# Patient Record
Sex: Female | Born: 1985 | Race: White | Hispanic: No | Marital: Married | Smoking: Never smoker
Health system: Southern US, Community
[De-identification: ages and names within clinical notes are randomized; demographics above are authoritative.]

## PROBLEM LIST (undated history)

## (undated) ENCOUNTER — Inpatient Hospital Stay (HOSPITAL_COMMUNITY): Payer: Self-pay

## (undated) DIAGNOSIS — Z789 Other specified health status: Secondary | ICD-10-CM

## (undated) DIAGNOSIS — R112 Nausea with vomiting, unspecified: Secondary | ICD-10-CM

## (undated) DIAGNOSIS — Z9889 Other specified postprocedural states: Secondary | ICD-10-CM

## (undated) HISTORY — PX: WISDOM TOOTH EXTRACTION: SHX21

## (undated) HISTORY — PX: HAND SURGERY: SHX662

## (undated) HISTORY — DX: Other specified postprocedural states: R11.2

## (undated) HISTORY — DX: Other specified postprocedural states: Z98.890

## (undated) HISTORY — PX: OTHER SURGICAL HISTORY: SHX169

## (undated) HISTORY — PX: NECK SURGERY: SHX720

---
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
## 2006-11-14 ENCOUNTER — Other Ambulatory Visit: Admission: RE | Admit: 2006-11-14 | Discharge: 2006-11-14 | Payer: Self-pay | Admitting: Obstetrics and Gynecology

## 2007-12-09 ENCOUNTER — Other Ambulatory Visit: Admission: RE | Admit: 2007-12-09 | Discharge: 2007-12-09 | Payer: Self-pay | Admitting: Obstetrics and Gynecology

## 2009-04-07 ENCOUNTER — Other Ambulatory Visit: Admission: RE | Admit: 2009-04-07 | Discharge: 2009-04-07 | Payer: Self-pay | Admitting: Obstetrics and Gynecology

## 2010-01-04 ENCOUNTER — Ambulatory Visit
Admission: RE | Admit: 2010-01-04 | Discharge: 2010-01-04 | Payer: Self-pay | Source: Home / Self Care | Attending: Sports Medicine | Admitting: Sports Medicine

## 2010-01-04 DIAGNOSIS — R109 Unspecified abdominal pain: Secondary | ICD-10-CM | POA: Insufficient documentation

## 2010-02-02 NOTE — Assessment & Plan Note (Signed)
Summary: NP,RT RIB CAGE CRAMPING,RUNNER,MC   Vital Signs:  Patient profile:   25 year old female Height:      65 inches Weight:      140 pounds BMI:     23.38 Pulse rate:   83 / minute BP sitting:   116 / 73  (left arm)  Vitals Entered By: Rochele Pages RN (January 04, 2010 10:01 AM) CC: rt rib cage cramping x 6 months   CC:  rt rib cage cramping x 6 months.  History of Present Illness: 63 F presents to clinic with rt ant rib cage cramping with running.  Cramping starts around mile 1.5 mile and is intense, then she needs to walk.  Cramping subsides after walking approx 2 minutes.  Cannot continue run without pain returning quickly.  Runs 10-13 miles per week.  Drinking plenty of water throughout day, eats bananas daily.  Denies heart, lung, respiratory problems. No other RUQ following fatty meals. Denies possible pregnancy.  Preventive Screening-Counseling & Management  Alcohol-Tobacco     Smoking Status: never  Allergies (verified): No Known Drug Allergies  Past History:  Past Medical History: denies  Past Surgical History: denies  Family History: denies h/o sudden death or CAD  Social History: Occupation: Audiological scientist estate Single Never Smoked Smoking Status:  never Occupation:  employed  Review of Systems  The patient denies anorexia, fever, weight loss, hematochezia, and severe indigestion/heartburn.    Physical Exam  General:  Well-developed,well-nourished,in no acute distress; alert,appropriate and cooperative throughout examination Head:  normocephalic.   Eyes:  vision grossly intact.   Neck:  supple.   Chest Wall:  No deformities, masses, or tenderness noted. Lungs:  Normal respiratory effort, chest expands symmetrically. Lungs are clear to auscultation, no crackles or wheezes. Heart:  Normal rate and regular rhythm. S1 and S2 normal without gallop, murmur, click, rub or other extra sounds. Abdomen:  Bowel sounds positive,abdomen soft and non-tender without  masses, organomegaly or hernias noted.  Neg Murphy's. Msk:  Leg length: equal Spine: mild thoracic scoliosis  Gait: Mild Rt sided tilt with walking. Mild intoeing with Rt foot. Running gait nl Neurologic:  alert & oriented X3.     Impression & Recommendations:  Problem # 1:  ABDOMINAL CRAMPS (ICD-789.00) Assessment New Pt has classic runner's stitch, likely caused by thoracic scoliosis altering her running gait slightly. - continue good hydration - see pt instructions for abd strength and rehab - f/u 6 weeks  Patient Instructions: 1)  Do 2-3 sets daily of all following exercises: 2)  Barbell lateral bends 3)  Barbell trunk rotations 4)  Front dips (Lt elbow to Rt knee, Rt elbow to Lt knee) 5)  Regular crunches 6)  Arm swings with dumb bells 7)  Follow up 6 weeks   Orders Added: 1)  New Patient Level III [43154]

## 2010-02-15 ENCOUNTER — Ambulatory Visit: Payer: Self-pay | Admitting: Sports Medicine

## 2010-04-11 ENCOUNTER — Other Ambulatory Visit: Payer: Self-pay | Admitting: Nurse Practitioner

## 2010-04-11 ENCOUNTER — Other Ambulatory Visit (HOSPITAL_COMMUNITY)
Admission: RE | Admit: 2010-04-11 | Discharge: 2010-04-11 | Disposition: A | Discharge status: Home / Self Care | Payer: 59 | Source: Ambulatory Visit | Attending: Obstetrics and Gynecology | Admitting: Obstetrics and Gynecology

## 2010-04-11 DIAGNOSIS — Z01419 Encounter for gynecological examination (general) (routine) without abnormal findings: Secondary | ICD-10-CM | POA: Insufficient documentation

## 2010-04-11 DIAGNOSIS — Z113 Encounter for screening for infections with a predominantly sexual mode of transmission: Secondary | ICD-10-CM | POA: Insufficient documentation

## 2010-05-19 NOTE — Op Note (Signed)
NAME:  Hailey Davis, Hailey Davis                        ACCOUNT NO.:  [id number]   MEDICAL RECORD NO.:  [id number]                   PATIENT TYPE:  AMB   LOCATION:  [facility]                                  FACILITY:  [facility]   PHYSICIAN:  Cindee Salt, M.D.                    DATE OF BIRTH:  September 16, 1985   DATE OF PROCEDURE:  09/14/2003  DATE OF DISCHARGE:                                 OPERATIVE REPORT   PREOPERATIVE DIAGNOSIS:  Fractured metacarpals right middle and right ring  fingers.   POSTOPERATIVE DIAGNOSIS:  Fractured metacarpals right middle and right ring  fingers.   OPERATION:  Open reduction and internal fixation fracture metacarpals right  ring and right middle fingers.   SURGEON:  Cindee Salt, M.D.   ASSISTANTCarolyne Fiscal   ANESTHESIA:  General.   HISTORY:  The patient is a 25 year old female who suffered a fall with a  fracture of her right middle and ring finger metacarpal.  This occurred when  she fell off a treadmill when she twisted her ankle.  She was seen in the  emergency room, placed in a splint, and referred.  She is seen with a spiral  oblique fracture of the proximal aspect of each finger.  She has overlap of  the middle onto the ring finger.   DESCRIPTION OF PROCEDURE:  She was brought to the operating room where a  general anesthetic was carried out without difficulty.  She was prepped  using DuraPrep in a supine position, right arm free.  The limb was  exsanguinated with an Esmarch bandage, tourniquet placed on the forearm, was  inflated to 200 mmHg.  A longitudinal incision was made between the middle  and ring finger metacarpals, carried down trough the subcutaneous tissues.  Bleeders were electrocauterized.  Neurovascular structures were protected.  The dissection was carried down between the extensor tendon.  The dissection  was carried over the middle finger, metacarpal first.  The fracture was  identified.  This was a long oblique rotated fracture.  The callus  and  granulation tissue was removed from between the fracture fragments.  A  reduction was performed.  The fracture was then clamped.  The flexion  extension showed no further angulation of overlap.  Drill holes were placed  for a 1.5 mm screw.  This was a 1.1 drill bit followed by a 1.5 till lag.  The screw head was counter sunk.  This was measured and found to be 9 mm.  Two screws were inserted, one distally, one proximally.  X-rays revealed  that the fracture line was still apparent.  A crack had formed in the dorsal  cortex between two screws and it was decided to proceed with a cerclage wire  to maintain reduction.  A 30 gauge monofilament stainless steel wire was  then inserted around the metacarpal.  This was then tightened over the two  screws.  After removing the distal screw to allow complete reduction, this  had very poor purchase and was redirected into the distal fragment.  This  firmly affixed it.  The wires were twisted and maintained around the screw  heads to maintain them in position.  This firmly closed the fracture site.  Again, flexion extension revealed no overlap.  An incision was then made in  the periosteum of the ring finger.  The fracture was maintained in reduction  and two screws were then placed.  These were each drilled and counter sunk.  These measured 8 and 7 mm.  These were placed.  X-rays revealed reduction of  the two fractures.  Flexion and extension revealed no angulation or overlap  to the fracture fragments.  The wound was copiously irrigated with saline.  The periosteum was then repaired with figure-of-eight 4-0 Vicryl sutures,  the subcutaneous tissue with 4-0 Vicryl and the skin with subcuticular 4-0  Monocryl sutures.  The area was infiltrated with 0.25% Marcaine without  epinephrine.  A sterile compressive dressing and splint was applied.  The patient  tolerated the procedure well and was taken to the recovery room for  observation in  satisfactory condition.  She is discharged home to return to  the [facility] in [place] in one week on Vicodin.                                               Cindee Salt, M.D.    GK/MEDQ  D:  09/14/2003  T:  09/14/2003  Job:  [id number]

## 2011-04-12 ENCOUNTER — Other Ambulatory Visit (HOSPITAL_COMMUNITY)
Admission: RE | Admit: 2011-04-12 | Discharge: 2011-04-12 | Disposition: A | Discharge status: Home / Self Care | Payer: 59 | Source: Ambulatory Visit | Attending: Obstetrics and Gynecology | Admitting: Obstetrics and Gynecology

## 2011-04-12 ENCOUNTER — Other Ambulatory Visit: Payer: Self-pay | Admitting: Nurse Practitioner

## 2011-04-12 DIAGNOSIS — Z113 Encounter for screening for infections with a predominantly sexual mode of transmission: Secondary | ICD-10-CM | POA: Insufficient documentation

## 2011-04-12 DIAGNOSIS — Z01419 Encounter for gynecological examination (general) (routine) without abnormal findings: Secondary | ICD-10-CM | POA: Insufficient documentation

## 2012-04-15 ENCOUNTER — Other Ambulatory Visit (HOSPITAL_COMMUNITY)
Admission: RE | Admit: 2012-04-15 | Discharge: 2012-04-15 | Disposition: A | Discharge status: Home / Self Care | Payer: 59 | Source: Ambulatory Visit | Attending: Obstetrics and Gynecology | Admitting: Obstetrics and Gynecology

## 2012-04-15 ENCOUNTER — Other Ambulatory Visit: Payer: Self-pay | Admitting: Nurse Practitioner

## 2012-04-15 DIAGNOSIS — Z01419 Encounter for gynecological examination (general) (routine) without abnormal findings: Secondary | ICD-10-CM | POA: Insufficient documentation

## 2015-04-28 ENCOUNTER — Other Ambulatory Visit (HOSPITAL_COMMUNITY)
Admission: RE | Admit: 2015-04-28 | Discharge: 2015-04-28 | Disposition: A | Discharge status: Home / Self Care | Payer: Commercial Managed Care - PPO | Source: Ambulatory Visit | Attending: Obstetrics and Gynecology | Admitting: Obstetrics and Gynecology

## 2015-04-28 ENCOUNTER — Other Ambulatory Visit: Payer: Self-pay | Admitting: Nurse Practitioner

## 2015-04-28 DIAGNOSIS — Z01419 Encounter for gynecological examination (general) (routine) without abnormal findings: Secondary | ICD-10-CM | POA: Diagnosis present

## 2015-04-29 LAB — CYTOLOGY - PAP

## 2016-08-08 ENCOUNTER — Other Ambulatory Visit: Payer: Self-pay | Admitting: Obstetrics and Gynecology

## 2016-08-08 DIAGNOSIS — R109 Unspecified abdominal pain: Principal | ICD-10-CM

## 2016-08-08 DIAGNOSIS — O26891 Other specified pregnancy related conditions, first trimester: Secondary | ICD-10-CM

## 2016-08-08 DIAGNOSIS — O30091 Twin pregnancy, unable to determine number of placenta and number of amniotic sacs, first trimester: Secondary | ICD-10-CM

## 2016-08-09 ENCOUNTER — Other Ambulatory Visit: Payer: Self-pay | Admitting: Obstetrics and Gynecology

## 2016-08-09 ENCOUNTER — Encounter (HOSPITAL_COMMUNITY): Payer: Self-pay

## 2016-08-09 ENCOUNTER — Ambulatory Visit (HOSPITAL_COMMUNITY)
Admission: RE | Admit: 2016-08-09 | Discharge: 2016-08-09 | Disposition: A | Discharge status: Home / Self Care | Payer: 59 | Source: Ambulatory Visit | Attending: Obstetrics and Gynecology | Admitting: Obstetrics and Gynecology

## 2016-08-09 DIAGNOSIS — O3680X2 Pregnancy with inconclusive fetal viability, fetus 2: Secondary | ICD-10-CM

## 2016-08-09 DIAGNOSIS — O3680X1 Pregnancy with inconclusive fetal viability, fetus 1: Secondary | ICD-10-CM

## 2016-08-09 DIAGNOSIS — Z3A09 9 weeks gestation of pregnancy: Secondary | ICD-10-CM

## 2016-08-09 DIAGNOSIS — O30031 Twin pregnancy, monochorionic/diamniotic, first trimester: Secondary | ICD-10-CM

## 2016-08-09 HISTORY — DX: Other specified health status: Z78.9

## 2016-08-09 NOTE — Progress Notes (Signed)
Maternal Fetal Medicine Consultation  Requesting Provider(s): [facility]  Primary OBDion Body: [place] Reason for consultation: Suspected monochorionic twins  HPI: 31yo P0 at 9+6 weeks by dates with twin gestation. US in office suspicious for monochorionic implantation. Pregnancy has been unremarkable so far. 2-3 pounds weight gain so far. No vaginal bleeding. Nearly constant nausea but little or no vomiting OB History: OB History    Gravida Para Term Preterm AB Living   1         0   SAB TAB Ectopic Multiple Live Births                  PMH:  Past Medical History:  Diagnosis Date  . Medical history non-contributory     PSH:  Past Surgical History:  Procedure Laterality Date  . arm surgery    . HAND SURGERY    . NECK SURGERY    . WISDOM TOOTH EXTRACTION     Meds: PNV, Diclegis Allergies: NKDA FH: See EPIC section Soc: See EPIC section  Review of Systems: no vaginal bleeding or cramping/contractions, no LOF, no nausea/vomiting. All other systems reviewed and are negative.  PNL: Patient is Rh negative   PE:  VS: See EPIC section GEN: well-appearing female ABD: gravid, NT  Please see separate document for fetal ultrasound report.  A/P:Monochorionic diamniotic twin gestation We complications common to all twin types, including risk of PTD, preeclampsia, and gestational diabetes, and the increased risk for fetal anomalies. I instructed her to begin an 81mg  ASA daily starting today. Her nutritional status is borderline. A more robust weight gain by 13 weeks is highly desirable. She is limited by her diet choices, but we discussed multiple vegetarian options to increase protein intake. I have asked her to strive for a 35/15/50 protein/fat/carb breakdown in her diet and have asked her to begin liquid supplements such as Boost or Ensure 3-4 cans per day. We discussed twin-to-twin transfusion at length, including the pathophysiology, presentation, and screening protocols. She has  an appointment to return for anatomic survey at 16 weeks and we will initiate scans every 2 weeks with EFW every other visit, if you so desire. All questions were answered  Thank you for the opportunity to be a part of the care of Hailey Davis. Please contact our office if we can be of further assistance.   I spent approximately 30 minutes with this patient with over 50% of time spent in face-to-face counseling.

## 2016-08-10 ENCOUNTER — Ambulatory Visit (HOSPITAL_COMMUNITY): Payer: Commercial Managed Care - PPO

## 2016-08-10 ENCOUNTER — Other Ambulatory Visit (HOSPITAL_COMMUNITY): Payer: Self-pay | Admitting: *Deleted

## 2016-08-10 DIAGNOSIS — O30039 Twin pregnancy, monochorionic/diamniotic, unspecified trimester: Secondary | ICD-10-CM

## 2016-08-29 ENCOUNTER — Encounter (HOSPITAL_COMMUNITY): Payer: Self-pay

## 2016-09-12 LAB — OB RESULTS CONSOLE HEPATITIS B SURFACE ANTIGEN: Hepatitis B Surface Ag: NEGATIVE

## 2016-09-12 LAB — OB RESULTS CONSOLE ABO/RH: RH Type: NEGATIVE

## 2016-09-12 LAB — OB RESULTS CONSOLE RUBELLA ANTIBODY, IGM: RUBELLA: IMMUNE

## 2016-09-12 LAB — OB RESULTS CONSOLE HIV ANTIBODY (ROUTINE TESTING): HIV: NONREACTIVE

## 2016-09-12 LAB — OB RESULTS CONSOLE RPR: RPR: NONREACTIVE

## 2016-09-12 LAB — OB RESULTS CONSOLE ANTIBODY SCREEN: ANTIBODY SCREEN: NEGATIVE

## 2016-09-27 ENCOUNTER — Other Ambulatory Visit (HOSPITAL_COMMUNITY): Payer: Self-pay | Admitting: Obstetrics and Gynecology

## 2016-09-27 ENCOUNTER — Ambulatory Visit (HOSPITAL_COMMUNITY)
Admission: RE | Admit: 2016-09-27 | Discharge: 2016-09-27 | Disposition: A | Discharge status: Home / Self Care | Payer: 59 | Source: Ambulatory Visit | Attending: Obstetrics and Gynecology | Admitting: Obstetrics and Gynecology

## 2016-09-27 ENCOUNTER — Other Ambulatory Visit (HOSPITAL_COMMUNITY): Payer: Self-pay | Admitting: *Deleted

## 2016-09-27 ENCOUNTER — Encounter (HOSPITAL_COMMUNITY): Payer: Self-pay

## 2016-09-27 DIAGNOSIS — Z3A16 16 weeks gestation of pregnancy: Secondary | ICD-10-CM

## 2016-09-27 DIAGNOSIS — O30032 Twin pregnancy, monochorionic/diamniotic, second trimester: Secondary | ICD-10-CM | POA: Diagnosis present

## 2016-09-27 DIAGNOSIS — Z363 Encounter for antenatal screening for malformations: Secondary | ICD-10-CM | POA: Insufficient documentation

## 2016-09-27 DIAGNOSIS — O30039 Twin pregnancy, monochorionic/diamniotic, unspecified trimester: Secondary | ICD-10-CM

## 2016-09-28 ENCOUNTER — Other Ambulatory Visit (HOSPITAL_COMMUNITY): Payer: Self-pay | Admitting: *Deleted

## 2016-09-28 DIAGNOSIS — O30039 Twin pregnancy, monochorionic/diamniotic, unspecified trimester: Secondary | ICD-10-CM

## 2016-10-11 ENCOUNTER — Ambulatory Visit (HOSPITAL_COMMUNITY)
Admission: RE | Admit: 2016-10-11 | Discharge: 2016-10-11 | Disposition: A | Discharge status: Home / Self Care | Payer: 59 | Source: Ambulatory Visit | Attending: Obstetrics and Gynecology | Admitting: Obstetrics and Gynecology

## 2016-10-11 ENCOUNTER — Other Ambulatory Visit (HOSPITAL_COMMUNITY): Payer: Self-pay | Admitting: Obstetrics and Gynecology

## 2016-10-11 ENCOUNTER — Encounter (HOSPITAL_COMMUNITY): Payer: Self-pay

## 2016-10-11 DIAGNOSIS — O321XX2 Maternal care for breech presentation, fetus 2: Secondary | ICD-10-CM | POA: Insufficient documentation

## 2016-10-11 DIAGNOSIS — O30039 Twin pregnancy, monochorionic/diamniotic, unspecified trimester: Secondary | ICD-10-CM

## 2016-10-11 DIAGNOSIS — O30032 Twin pregnancy, monochorionic/diamniotic, second trimester: Secondary | ICD-10-CM | POA: Diagnosis present

## 2016-10-11 DIAGNOSIS — Z3A18 18 weeks gestation of pregnancy: Secondary | ICD-10-CM | POA: Insufficient documentation

## 2016-10-25 ENCOUNTER — Encounter (HOSPITAL_COMMUNITY): Payer: Self-pay

## 2016-10-25 ENCOUNTER — Ambulatory Visit (HOSPITAL_COMMUNITY)
Admission: RE | Admit: 2016-10-25 | Discharge: 2016-10-25 | Disposition: A | Discharge status: Home / Self Care | Payer: 59 | Source: Ambulatory Visit | Attending: Obstetrics and Gynecology | Admitting: Obstetrics and Gynecology

## 2016-10-25 ENCOUNTER — Other Ambulatory Visit (HOSPITAL_COMMUNITY): Payer: Self-pay | Admitting: Obstetrics and Gynecology

## 2016-10-25 DIAGNOSIS — O30039 Twin pregnancy, monochorionic/diamniotic, unspecified trimester: Secondary | ICD-10-CM | POA: Diagnosis present

## 2016-10-25 DIAGNOSIS — Z362 Encounter for other antenatal screening follow-up: Secondary | ICD-10-CM

## 2016-10-25 DIAGNOSIS — Z3A20 20 weeks gestation of pregnancy: Secondary | ICD-10-CM

## 2016-10-25 DIAGNOSIS — O30032 Twin pregnancy, monochorionic/diamniotic, second trimester: Secondary | ICD-10-CM | POA: Diagnosis not present

## 2016-11-08 ENCOUNTER — Encounter (HOSPITAL_COMMUNITY): Payer: Self-pay

## 2016-11-08 ENCOUNTER — Ambulatory Visit (HOSPITAL_COMMUNITY)
Admission: RE | Admit: 2016-11-08 | Discharge: 2016-11-08 | Disposition: A | Discharge status: Home / Self Care | Payer: 59 | Source: Ambulatory Visit | Attending: Obstetrics and Gynecology | Admitting: Obstetrics and Gynecology

## 2016-11-08 DIAGNOSIS — O30032 Twin pregnancy, monochorionic/diamniotic, second trimester: Secondary | ICD-10-CM | POA: Insufficient documentation

## 2016-11-08 DIAGNOSIS — O30039 Twin pregnancy, monochorionic/diamniotic, unspecified trimester: Secondary | ICD-10-CM

## 2016-11-08 DIAGNOSIS — Z3A22 22 weeks gestation of pregnancy: Secondary | ICD-10-CM | POA: Insufficient documentation

## 2016-11-08 DIAGNOSIS — O09892 Supervision of other high risk pregnancies, second trimester: Secondary | ICD-10-CM | POA: Insufficient documentation

## 2016-11-21 ENCOUNTER — Ambulatory Visit (HOSPITAL_COMMUNITY): Payer: Commercial Managed Care - PPO

## 2016-12-06 ENCOUNTER — Ambulatory Visit (HOSPITAL_COMMUNITY): Payer: Commercial Managed Care - PPO

## 2016-12-20 ENCOUNTER — Ambulatory Visit (HOSPITAL_COMMUNITY): Payer: Commercial Managed Care - PPO

## 2017-01-25 ENCOUNTER — Encounter (HOSPITAL_COMMUNITY): Payer: Self-pay

## 2017-01-25 ENCOUNTER — Inpatient Hospital Stay (HOSPITAL_COMMUNITY)
Admission: AD | Admit: 2017-01-25 | Discharge: 2017-01-27 | DRG: 819 | Disposition: A | Discharge status: Home / Self Care | Payer: 59 | Source: Ambulatory Visit | Attending: Obstetrics and Gynecology | Admitting: Obstetrics and Gynecology

## 2017-01-25 ENCOUNTER — Encounter: Payer: Self-pay | Admitting: General Practice

## 2017-01-25 ENCOUNTER — Other Ambulatory Visit: Payer: Self-pay

## 2017-01-25 DIAGNOSIS — O91119 Abscess of breast associated with pregnancy, unspecified trimester: Secondary | ICD-10-CM | POA: Diagnosis present

## 2017-01-25 DIAGNOSIS — O91113 Abscess of breast associated with pregnancy, third trimester: Principal | ICD-10-CM | POA: Diagnosis present

## 2017-01-25 DIAGNOSIS — Z3A34 34 weeks gestation of pregnancy: Secondary | ICD-10-CM | POA: Diagnosis not present

## 2017-01-25 DIAGNOSIS — O30033 Twin pregnancy, monochorionic/diamniotic, third trimester: Secondary | ICD-10-CM | POA: Diagnosis present

## 2017-01-25 LAB — CBC
HEMATOCRIT: 36.5 % (ref 36.0–46.0)
HEMOGLOBIN: 12.7 g/dL (ref 12.0–15.0)
MCH: 31.4 pg (ref 26.0–34.0)
MCHC: 34.8 g/dL (ref 30.0–36.0)
MCV: 90.3 fL (ref 78.0–100.0)
Platelets: 168 10*3/uL (ref 150–400)
RBC: 4.04 MIL/uL (ref 3.87–5.11)
RDW: 12.9 % (ref 11.5–15.5)
WBC: 14.4 10*3/uL — ABNORMAL HIGH (ref 4.0–10.5)

## 2017-01-25 LAB — CREATININE, SERUM
CREATININE: 0.7 mg/dL (ref 0.44–1.00)
GFR calc non Af Amer: >60 mL/min (ref >60)

## 2017-01-25 MED ORDER — CALCIUM CARBONATE ANTACID 500 MG PO CHEW: 2.0000 | CHEWABLE_TABLET | ORAL | Status: DC | PRN

## 2017-01-25 MED ORDER — ZOLPIDEM TARTRATE 5 MG PO TABS
5.0000 mg | ORAL_TABLET | Freq: Every evening | ORAL | Status: DC | PRN
  Administered 2017-01-25: 5 mg via ORAL
  Filled 2017-01-25: qty 1

## 2017-01-25 MED ORDER — ACETAMINOPHEN 325 MG PO TABS
650.0000 mg | ORAL_TABLET | ORAL | Status: DC | PRN
  Administered 2017-01-25 – 2017-01-27 (×3): 650 mg via ORAL
  Filled 2017-01-25 (×3): qty 2

## 2017-01-25 MED ORDER — ASPIRIN 81 MG PO CHEW
81.0000 mg | CHEWABLE_TABLET | Freq: Every day | ORAL | Status: DC
  Administered 2017-01-27: 81 mg via ORAL
  Filled 2017-01-25 (×4): qty 1

## 2017-01-25 MED ORDER — RISAQUAD PO CAPS
1.0000 | ORAL_CAPSULE | Freq: Two times a day (BID) | ORAL | Status: DC
  Administered 2017-01-25 – 2017-01-27 (×3): 1 via ORAL
  Filled 2017-01-25 (×6): qty 1

## 2017-01-25 MED ORDER — BACID PO TABS: 2.0000 | ORAL_TABLET | Freq: Two times a day (BID) | ORAL | Status: DC

## 2017-01-25 MED ORDER — DOCUSATE SODIUM 100 MG PO CAPS
100.0000 mg | ORAL_CAPSULE | Freq: Every day | ORAL | Status: DC
  Administered 2017-01-27: 100 mg via ORAL
  Filled 2017-01-25 (×2): qty 1

## 2017-01-25 MED ORDER — LACTATED RINGERS IV SOLN
INTRAVENOUS | Status: DC
  Administered 2017-01-25 – 2017-01-26 (×3): via INTRAVENOUS

## 2017-01-25 MED ORDER — PRENATAL MULTIVITAMIN CH: 1.0000 | ORAL_TABLET | Freq: Every day | ORAL | Status: DC

## 2017-01-25 MED ORDER — VANCOMYCIN HCL IN DEXTROSE 1-5 GM/200ML-% IV SOLN
1000.0000 mg | Freq: Three times a day (TID) | INTRAVENOUS | Status: DC
  Administered 2017-01-25 – 2017-01-27 (×6): 1000 mg via INTRAVENOUS
  Filled 2017-01-25 (×8): qty 200

## 2017-01-25 MED ORDER — OXYCODONE HCL 5 MG PO TABS
5.0000 mg | ORAL_TABLET | ORAL | Status: DC | PRN
  Administered 2017-01-27: 5 mg via ORAL
  Filled 2017-01-25: qty 1

## 2017-01-25 NOTE — H&P (Signed)
OB ADMISSION/ HISTORY & PHYSICAL:  Admission Date: 01/25/2017  3:52 PM  Admit Diagnosis: Left breast abscess, twin pregnancy at 6432w0d, MoDi.  Hailey Davis is a 32 y.o. female presenting for [name], planned surgical management.  Prenatal History: G1P0   EDC : 03/08/2017, by Ultrasound  [facility] since [redacted] weeks gestation, CNM/MD comanagement  Prenatal course complicated by Mo/Di pregnancy, concordant growth, [name] that started developing at 22 wks.  Conservative management until worsened earlier this week, attempted sono guided draining outpatient and unsuccessful today.   Prenatal Labs: ABO, Rh:   O neg (Rhogam rcvd at 28 wks) Antibody:  NEG Rubella:   IMM RPR:   NR HBsAg:   neg HIV:   neg GBS:   unknown 1 hr Glucola : 99   Medical / Surgical History :  Past medical history:  Past Medical History:  Diagnosis Date  . Medical history non-contributory      Past surgical history:  Past Surgical History:  Procedure Laterality Date  . arm surgery    . HAND SURGERY    . NECK SURGERY    . WISDOM TOOTH EXTRACTION       Family History: History reviewed. No pertinent family history.   Social History:  reports that  has never smoked. she has never used smokeless tobacco. She reports that she does not drink alcohol or use drugs.   Allergies: Patient has no known allergies.    Current Medications at time of admission:  PNV, baby ASA, oral ABX Keflex 500 mg q 6 hrs    Review of Systems: Review of Systems  Constitutional: Positive for chills. Negative for fever.  HENT: Negative.   Eyes: Negative.   Respiratory: Negative.   Cardiovascular: Negative.   Gastrointestinal: Negative.   Genitourinary: Negative.   Musculoskeletal: Negative.   Skin:       Redness and pain in in breast  Neurological: Negative.   Psychiatric/Behavioral: Negative.    + FM, no LOF, no VB. + ctx, pressure, no pain  Physical  Exam:  Vitals:   01/25/17 1625 01/25/17 1649 01/25/17 1701 01/25/17 1721  BP: 116/71   116/71  Pulse: 96   96  Resp:    18  Temp: 97.7 F (36.5 C)  97.7 F (36.5 C) 97.7 F (36.5 C)  TempSrc: Oral  Oral Oral  SpO2: 100%   100%  Weight:  89.8 kg (198 lb) 89.8 kg (198 lb)   Height:  5\' 5"  (1.651 m) 5\' 5"  (1.651 m)     General: AAO x 3, NAD Heart: RRR Lungs: CTAB Breast: R side normal, L side red, lump 2 in diameter underside of breast at 1800, serosanguinous drainage from puncture site minimal Abdomen: gravid, NT Extremities: no edema Genitalia / VE: deferred FHR: Twin A 135, mod var, + accels, no decels  Twin B difficult to trace, lots of FM, FHR 140's TOCO: ctx mild irregular  Labs:    CBC, T&S pending     Assessment/Plan:  32 y.o. G1P0 at 8432w0d, Mo Di Twins, L breast  abscess  Admit to ante Surgical consult in am IV Vancomycin per surgeon's preference, DC oral Keflex NST daily, FHT q shift IS saline lock, LR at 125 with ABX infusion Regular diet Ambien for sleep Add daily probiotic - yeast rebound prevention TED hose while inpatient Continue PNV, baby ASA  Dr Billy Coastaavon consult for plan of care   Neta Mendsaniela C Jurnie Garritano CNM, MSN  01/25/2017, 5:43 PM

## 2017-01-25 NOTE — Progress Notes (Signed)
Pharmacy Antibiotic Note  Hailey Davis is a 32 y.o. female admitted on 01/25/2017 with Left breast abscess and 34 week twin gestation.  Pharmacy has been consulted for Vancomycin dosing after failed outpatient Bactrim therapy and failed u/s guided aspiration.   Plan: Vancomycin 1000mg  IV q8h Will plan to draw Vanc trough at steady state to maintain trough level 10-1715mcg/ml  Height: 5\' 5"  (165.1 cm) Weight: 198 lb (89.8 kg) IBW/kg (Calculated) : 57  Temp (24hrs), Avg:97.7 F (36.5 C), Min:97.7 F (36.5 C), Max:97.7 F (36.5 C)  Recent Labs  Lab 01/25/17 1702  CREATININE 0.70    Estimated Creatinine Clearance: 113.8 mL/min (by C-Davis formula based on SCr of 0.7 mg/dL).    No Known Allergies    Thank you for allowing pharmacy to be a part of this patient's care.  Hailey Davis, Hailey Davis 01/25/2017 5:43 PM

## 2017-01-25 NOTE — Progress Notes (Signed)
Hailey Davis Documented: 01/25/2017 1:54 PM Location: [facility] Patient #: [id number] DOB: 1985/11/28 Married / Language: English / Race: White Female   History of Present Illness  The patient is a 32 year old female who presents with a breast abscess. She is [redacted] weeks pregnant with twins, presenting with a left breast abscess. She states the area has been palpable since November, but has increased in size and became more tender in the past week. She began Bactrim on 01/22/17 given by Dr. Billy Coast, her OB/GYN.  Ultrasound performed at [facility] mammography this morning (01/25/17) showed 7.5 cm x 5.4 cm overall multiloculated abscess in the left breast, inferior quadrant. The abscesses have mixed echogenicity so aspiration was not attempted. She was sent to [facility] for further evaluation.   I attempted aspiration but only removed 2 cc of purulent fluid.  She was sent back to [facility] to attempt ultrasound guided aspiration, but no fluid was able to be aspirated due to its' thickness. She came back to [facility] and I will contact Dr. Billy Coast to discuss either admission with IV antibiotics or possible incision and drainage with local and some sedation in the OR to help the abscess resolve.    Review of Systems  General Not Present- Appetite Loss, Chills, Fatigue, Fever, Night Sweats, Weight Gain and Weight Loss. Skin Not Present- Change in Wart/Mole, Dryness, Hives, Jaundice, New Lesions, Non-Healing Wounds, Rash and Ulcer. HEENT Not Present- Earache, Hearing Loss, Hoarseness, Nose Bleed, Oral Ulcers, Ringing in the Ears, Seasonal Allergies, Sinus Pain, Sore Throat, Visual Disturbances, Wears glasses/contact lenses and Yellow Eyes. Respiratory Not Present- Bloody sputum, Chronic Cough, Difficulty Breathing, Snoring and Wheezing. Breast Present- Breast Mass, Breast Pain, Nipple Discharge and Skin Changes. Cardiovascular Present- Leg Cramps and Swelling of Extremities. Not Present- Chest Pain,  Difficulty Breathing Lying Down, Palpitations, Rapid Heart Rate and Shortness of Breath. Gastrointestinal Not Present- Abdominal Pain, Bloating, Bloody Stool, Change in Bowel Habits, Chronic diarrhea, Constipation, Difficulty Swallowing, Excessive gas, Gets full quickly at meals, Hemorrhoids, Indigestion, Nausea, Rectal Pain and Vomiting. Female Genitourinary Present- Frequency. Not Present- Nocturia, Painful Urination, Pelvic Pain and Urgency. Musculoskeletal Not Present- Back Pain, Joint Pain, Joint Stiffness, Muscle Pain, Muscle Weakness and Swelling of Extremities. Neurological Not Present- Decreased Memory, Fainting, Headaches, Numbness, Seizures, Tingling, Tremor, Trouble walking and Weakness. Psychiatric Not Present- Anxiety, Bipolar, Change in Sleep Pattern, Depression, Fearful and Frequent crying. Endocrine Not Present- Cold Intolerance, Excessive Hunger, Hair Changes, Heat Intolerance, Hot flashes and New Diabetes. Hematology Not Present- Blood Thinners, Easy Bruising, Excessive bleeding, Gland problems, HIV and Persistent Infections.   Physical Exam GENERAL: Well-developed, well nourished gravid female, tearful  EYES: No scleral icterus Pupils equal, lids normal  EXTERNAL EARS: Intact, no masses or lesions EXTERNAL NOSE: Intact, no masses or lesions MOUTH: Lips - no lesions Dentition - normal for age  RESPIRATORY: Normal effort, no use of accessory muscles  MUSCULOSKELETAL: Normal gait Grossly normal ROM upper extremities Grossly normal ROM lower extremities  SKIN: Warm and dry Not diaphoretic  PSYCHIATRIC: Normal judgement and insight Normal mood and affect Alert, oriented x 3  Breast Note: Left inferior breast: Moderate size area of erythema/cellulitis with central fluctuance and swelling Some active purulent drainage from the previous aspiration sites        Assessment & Plan  LEFT BREAST ABSCESS (N61.1) Impression: She is a [redacted] week pregnant  female who presented to [facility] for evaluation of left breast abscess which showed 7.5 cm x 5.4 cm multiloculated abscess on ultrasound  this [facility]. I attempted aspiration but only drained 2 cc of purulent material - this was sent for culture. [facility] guided aspiration, but fluid was too thick to aspirate.  I contacted Dr. Billy Coastaavon and he stated he defers the decision for IV antibiotics vs OR to the surgeons. After speaking to a surgeon in the office, it was recommended that we try to avoid any surgical procedure, if possible, due to her pregnancy and the risk of developing a milk fistula with an open wound. So, [facility] and the LDOW physician, Dr. Johna SheriffHoxworth, has agreed to evaluate her tomorrow for improvement or worsening symptoms. He will determine further treatment thereafter.  Signed electronically by Tsosie BillingPuja G Aengus Sauceda, PA C (01/25/2017 3:32 PM)  Co-signed electronically by Berna Buehelsea A Connor MD (01/25/2017 3:34 PM)

## 2017-01-25 NOTE — Progress Notes (Signed)
Rn at bedside for Continuous US adjustment for baby B for 40+ minutes.  CNM aware.

## 2017-01-26 ENCOUNTER — Encounter (HOSPITAL_COMMUNITY)
Admission: AD | Disposition: A | Discharge status: Home / Self Care | Payer: Self-pay | Source: Ambulatory Visit | Attending: Obstetrics and Gynecology

## 2017-01-26 ENCOUNTER — Inpatient Hospital Stay (HOSPITAL_COMMUNITY): Payer: 59 | Admitting: Anesthesiology

## 2017-01-26 HISTORY — PX: INCISION AND DRAINAGE ABSCESS: SHX5864

## 2017-01-26 SURGERY — INCISION AND DRAINAGE, ABSCESS
Anesthesia: General | Site: Breast | Laterality: Left

## 2017-01-26 MED ORDER — FENTANYL CITRATE (PF) 100 MCG/2ML IJ SOLN
INTRAMUSCULAR | Status: DC | PRN
  Administered 2017-01-26: 50 ug via INTRAVENOUS
  Administered 2017-01-26: 100 ug via INTRAVENOUS
  Administered 2017-01-26: 50 ug via INTRAVENOUS

## 2017-01-26 MED ORDER — MEPERIDINE HCL 25 MG/ML IJ SOLN: 6.2500 mg | INTRAMUSCULAR | Status: DC | PRN

## 2017-01-26 MED ORDER — FENTANYL CITRATE (PF) 100 MCG/2ML IJ SOLN
INTRAMUSCULAR | Status: AC
  Filled 2017-01-26: qty 2

## 2017-01-26 MED ORDER — FENTANYL CITRATE (PF) 100 MCG/2ML IJ SOLN: 25.0000 ug | INTRAMUSCULAR | Status: DC | PRN

## 2017-01-26 MED ORDER — PROPOFOL 10 MG/ML IV BOLUS
INTRAVENOUS | Status: DC | PRN
  Administered 2017-01-26: 50 mg via INTRAVENOUS
  Administered 2017-01-26: 30 mg via INTRAVENOUS
  Administered 2017-01-26: 50 mg via INTRAVENOUS
  Administered 2017-01-26: 30 mg via INTRAVENOUS
  Administered 2017-01-26: 200 mg via INTRAVENOUS

## 2017-01-26 MED ORDER — PROPOFOL 10 MG/ML IV BOLUS
INTRAVENOUS | Status: AC
Start: 2017-01-26 — End: 2017-01-26
  Filled 2017-01-26: qty 20

## 2017-01-26 MED ORDER — BUPIVACAINE HCL (PF) 0.25 % IJ SOLN
INTRAMUSCULAR | Status: AC
  Filled 2017-01-26: qty 30

## 2017-01-26 MED ORDER — KETOROLAC TROMETHAMINE 30 MG/ML IJ SOLN: 30.0000 mg | Freq: Once | INTRAMUSCULAR | Status: DC | PRN

## 2017-01-26 MED ORDER — LIDOCAINE HCL (CARDIAC) 20 MG/ML IV SOLN
INTRAVENOUS | Status: AC
  Filled 2017-01-26: qty 5

## 2017-01-26 MED ORDER — SUCCINYLCHOLINE CHLORIDE 200 MG/10ML IV SOSY
PREFILLED_SYRINGE | INTRAVENOUS | Status: AC
  Filled 2017-01-26: qty 10

## 2017-01-26 MED ORDER — OXYCODONE HCL 5 MG PO TABS: 5.0000 mg | ORAL_TABLET | Freq: Once | ORAL | Status: DC | PRN

## 2017-01-26 MED ORDER — SUCCINYLCHOLINE CHLORIDE 20 MG/ML IJ SOLN
INTRAMUSCULAR | Status: DC | PRN
  Administered 2017-01-26: 120 mg via INTRAVENOUS

## 2017-01-26 MED ORDER — ONDANSETRON HCL 4 MG/2ML IJ SOLN
INTRAMUSCULAR | Status: AC
  Filled 2017-01-26: qty 2

## 2017-01-26 MED ORDER — PROPOFOL 500 MG/50ML IV EMUL
INTRAVENOUS | Status: DC | PRN
  Administered 2017-01-26: 150 ug/kg/min via INTRAVENOUS

## 2017-01-26 MED ORDER — OXYCODONE HCL 5 MG/5ML PO SOLN: 5.0000 mg | Freq: Once | ORAL | Status: DC | PRN

## 2017-01-26 MED ORDER — SOD CITRATE-CITRIC ACID 500-334 MG/5ML PO SOLN
30.0000 mL | Freq: Once | ORAL | Status: AC
  Administered 2017-01-26: 30 mL via ORAL
  Filled 2017-01-26: qty 15

## 2017-01-26 MED ORDER — LIDOCAINE HCL (CARDIAC) 20 MG/ML IV SOLN
INTRAVENOUS | Status: DC | PRN
  Administered 2017-01-26: 100 mg via INTRAVENOUS

## 2017-01-26 MED ORDER — PROPOFOL 10 MG/ML IV BOLUS
INTRAVENOUS | Status: AC
  Filled 2017-01-26: qty 20

## 2017-01-26 MED ORDER — PHENYLEPHRINE HCL 10 MG/ML IJ SOLN
INTRAMUSCULAR | Status: DC | PRN
  Administered 2017-01-26 (×2): 80 ug via INTRAVENOUS

## 2017-01-26 MED ORDER — ACETAMINOPHEN 325 MG PO TABS: 325.0000 mg | ORAL_TABLET | ORAL | Status: DC | PRN

## 2017-01-26 MED ORDER — BUPIVACAINE HCL (PF) 0.25 % IJ SOLN
INTRAMUSCULAR | Status: DC | PRN
  Administered 2017-01-26: 30 mL

## 2017-01-26 MED ORDER — DEXAMETHASONE SODIUM PHOSPHATE 10 MG/ML IJ SOLN
INTRAMUSCULAR | Status: AC
  Filled 2017-01-26: qty 1

## 2017-01-26 MED ORDER — ACETAMINOPHEN 160 MG/5ML PO SOLN: 325.0000 mg | ORAL | Status: DC | PRN

## 2017-01-26 MED ORDER — ONDANSETRON HCL 4 MG/2ML IJ SOLN: 4.0000 mg | Freq: Once | INTRAMUSCULAR | Status: DC | PRN

## 2017-01-26 SURGICAL SUPPLY — 15 items
ELECT REM PT RETURN 9FT ADLT (ELECTROSURGICAL) ×3
ELECTRODE REM PT RTRN 9FT ADLT (ELECTROSURGICAL) IMPLANT
GAUZE PACKING IODOFORM 1X5 (MISCELLANEOUS) ×2 IMPLANT
GAUZE SPONGE 4X4 12PLY STRL LF (GAUZE/BANDAGES/DRESSINGS) ×2 IMPLANT
GOWN STRL REUS W/ TWL LRG LVL3 (GOWN DISPOSABLE) IMPLANT
GOWN STRL REUS W/ TWL XL LVL3 (GOWN DISPOSABLE) IMPLANT
GOWN STRL REUS W/TWL LRG LVL3 (GOWN DISPOSABLE) ×6
GOWN STRL REUS W/TWL XL LVL3 (GOWN DISPOSABLE) ×3
IV SOD CHL 0.9% 1000ML (IV SOLUTION) ×2 IMPLANT
NEEDLE HYPO 22GX1.5 SAFETY (NEEDLE) ×2 IMPLANT
PACK ABDOMINAL GYN (CUSTOM PROCEDURE TRAY) ×2 IMPLANT
PAD ABD 8X7 1/2 STERILE (GAUZE/BANDAGES/DRESSINGS) ×2 IMPLANT
SYR CONTROL 10ML LL (SYRINGE) ×2 IMPLANT
TAPE CLOTH SURG 4X10 WHT LF (GAUZE/BANDAGES/DRESSINGS) ×2 IMPLANT
TOWEL OR 17X26 10 PK STRL BLUE (TOWEL DISPOSABLE) ×4 IMPLANT

## 2017-01-26 NOTE — Transfer of Care (Signed)
Immediate Anesthesia Transfer of Care Note  Patient: Hailey Davis  Procedure(s) Performed: INCISION AND DRAINAGE ABSCESS (Left Breast)  Patient Location: PACU  Anesthesia Type:General  Level of Consciousness: awake, alert , oriented and patient cooperative  Airway & Oxygen Therapy: Patient Spontanous Breathing and Patient connected to face mask oxygen  Post-op Assessment: Report given to RN, Post -op Vital signs reviewed and stable and Patient moving all extremities X 4  Post vital signs: Reviewed and stable  Last Vitals:  Vitals:   01/26/17 0753 01/26/17 1144  BP: 108/68 112/65  Pulse: (!) 59 85  Resp: 16 16  Temp: 36.7 C 36.6 C  SpO2: 98% 97%    Last Pain:  Vitals:   01/26/17 1144  TempSrc: Oral  PainSc:       Patients Stated Pain Goal: 1 (01/25/17 2303)  Complications: No apparent anesthesia complications

## 2017-01-26 NOTE — Progress Notes (Signed)
Patient ID: Sharyon CableCourtney B Cardona, female   DOB: 1985-10-14, 32 y.o.   MRN: [id number]     Subjective: Feels about the same.  Still discomfort and swelling inferior left breast.  Objective: Vital signs in last 24 hours: Temp:  [97.5 F (36.4 C)-98.9 F (37.2 C)] 98.1 F (36.7 C) (01/26 0753) Pulse Rate:  [59-96] 59 (01/26 0753) Resp:  [15-18] 16 (01/26 0753) BP: (97-122)/(59-79) 108/68 (01/26 0753) SpO2:  [97 %-100 %] 98 % (01/26 0753) Weight:  [89.8 kg (198 lb)] 89.8 kg (198 lb) (01/25 1701) Last BM Date: 01/24/17  Intake/Output from previous day: No intake/output data recorded. Intake/Output this shift: No intake/output data recorded.  General appearance: alert, cooperative and no distress Breasts: normal appearance, no masses or tenderness, Approximately 10 cm area of erythema and induration and tenderness inferior left breast without fluctuance.  Lab Results:  Recent Labs    01/25/17 1801  WBC 14.4*  HGB 12.7  HCT 36.5  PLT 168   BMET Recent Labs    01/25/17 1702  CREATININE 0.70     Studies/Results: No results found.  Anti-infectives: Anti-infectives (From admission, onward)   Start     Dose/Rate Route Frequency Ordered Stop   01/25/17 1800  vancomycin (VANCOCIN) IVPB 1000 mg/200 mL premix     1,000 mg 200 mL/hr over 60 Minutes Intravenous Every 8 hours 01/25/17 1737        Assessment/Plan: Multiloculated left breast abscess during pregnancy.  Unable to treat with image guided aspiration.  Appears about the same today.  I discussed options with the patient and her husband.  That would include continued antibiotics with some concern that this would not resolve or worsen with that treatment versus proceeding with surgical drainage which is more likely to quickly resolve the abscess but of course would involve general anesthesia and an incision.  We discussed pros and cons and all their questions were answered.  She feels like she is agreeable with proceeding  with incision and drainage which I think is more likely to get this resolved in a timely fashion.  We discussed the nature of surgery and risks of bleeding infection or persistent/recurrent abscess.  Discussed slight risks of general anesthesia and she would like to discuss anesthesia and pregnancy with anesthesiologist preoperatively.  She did have breakfast this morning and we will plan to keep n.p.o. and proceed with I&D of left breast later this afternoon.    LOS: 1 day    Mariella SaaBenjamin T Fara Worthy 01/26/2017

## 2017-01-26 NOTE — Op Note (Signed)
Preoperative Diagnosis: Left breast abscess  Postoprative Diagnosis: Same  Procedure: Procedure(s): INCISION AND DRAINAGE LEFT BREAST ABSCESS   Surgeon: Glenna FellowsHoxworth, Barnaby Rippeon T   Assistants: None  Anesthesia: General LMA  Indications: Patient is a 32 year old female, 34-week gestation with a several week history of worsening left breast redness and swelling and pain.  This has not been responsive to outpatient antibiotics.  Attempt at ultrasound aspiration at the breast center was performed yesterday and at our office yesterday without significant pus obtained.  Ultrasound has shown a large, approximately 7 cm multiloculated abscess.  I recommended proceeding with incision and drainage under general anesthesia.  We have discussed alternatives, the nature and risks of surgery and expected recovery and she agrees and understands and desires to proceed.    Procedure Detail: Patient was brought to the operating room, placed in supine position on the operating table and laryngeal mask general anesthesia induced.  She was already on IV vancomycin.  The left breast was widely sterilely prepped and draped.  Patient timeout was performed and correct procedure verified.  Examination of the breast showed a large area of induration and erythema and swelling occupying essentially the lower half of the breast.  There was an area of fluctuance palpable just superior to the inframammary crease.  I made a curvilinear incision at this point and dissection was carried down through the subcutaneous tissue and a large abscess with thick purulent material was entered.  This was cultured.  A large amount of pus was drained.  There was immediate improvement in the swelling and tenseness of the breast.  There were a couple of loculations but these were good-sized and could be easily explored and broken up with my finger and extended up toward the nipple in the lower midline of the breast and somewhat on either side but more  posteriorly.  All loculations were broken up.  The cavity was thoroughly irrigated with saline.  The soft tissue was infiltrated with Marcaine.  The abscess cavity was packed with 1 inch iodoform gauze.  Dry sterile dressing was applied.  Sponge needle and instrument counts were correct.    Findings: As above  Estimated Blood Loss:  Minimal         Drains: Abscess cavity packed with one-inch iodoform gauze  Blood Given: none          Specimens: Culture and sensitivity        Complications:  * No complications entered in OR log *         Disposition: PACU - hemodynamically stable.         Condition: stable

## 2017-01-26 NOTE — Progress Notes (Signed)
Pt being sent down to OR for procedure. CHG bath given, gave betadine nose swabs, and sodium bi-citrate given.

## 2017-01-26 NOTE — Progress Notes (Signed)
HD # 2 - [name], [name]   S: Feels well, mild anxiety over procedure today, denies ctx, good FM, no LOF/VB. Breast sore, about the same, no fever/chills.   O: BP 112/65 (BP Location: Right Arm)   Pulse 85   Temp 97.9 F (36.6 C) (Oral)   Resp 16   Ht 5\' 5"  (1.651 m)   Wt 89.8 kg (198 lb)   LMP 05/24/2016   SpO2 97%   BMI 32.95 kg/m    FHT: Twin A 135 BL, mod var, + accels, no decels  Twin B 140, BL, mod var, + accels, no decels  Toco: mild, irregular GU: deferred  A/P: 32 y.o. admitted with [name], increasing  Mo/Di twin gestation, concordant growth Dating:  426w1d  FWB:  Cat 1, NST reactive today General surgeon consult completed, planned surgical drainage under general anesthesia Procedure schedule today at 1600 NPO since AM, IV fluids continuous IV abx per surgical consult  Continuous EFM during procedure and recovery L&D charge nurse notified of planned procedure and will coordinate for care  Continue all other orders as established.   Dr. Billy Coastaavon updated   Hailey Davis, CNM, MSN 01/26/2017, 1:20 PM

## 2017-01-26 NOTE — Anesthesia Procedure Notes (Signed)
Procedure Name: Intubation Performed by: Trellis PaganiniBrewer, Londen Bok N, CRNA Pre-anesthesia Checklist: Patient identified, Emergency Drugs available, Suction available and Patient being monitored Patient Re-evaluated:Patient Re-evaluated prior to induction Oxygen Delivery Method: Circle system utilized Preoxygenation: Pre-oxygenation with 100% oxygen Induction Type: IV induction, Rapid sequence and Cricoid Pressure applied Ventilation: Oral airway inserted - appropriate to patient size Laryngoscope Size: Glidescope and 3 Tube type: Oral Tube size: 7.0 mm Number of attempts: 1 Airway Equipment and Method: Stylet and Oral airway Placement Confirmation: ETT inserted through vocal cords under direct vision,  positive ETCO2 and breath sounds checked- equal and bilateral Secured at: 22 (@teeth ) cm Tube secured with: Tape Dental Injury: Teeth and Oropharynx as per pre-operative assessment

## 2017-01-26 NOTE — H&P (Signed)
OB ADMISSION/ HISTORY & PHYSICAL:  Admission Date: 01/25/2017  3:52 PM  Admit Diagnosis: Left breast abscess, twin pregnancy at [redacted]w[redacted]d, MoDi.  Hailey Davis is a 32 y.o. female presenting for [name], planned surgical management.  Prenatal History: G1P0   EDC : 03/08/2017, by Ultrasound  Prenatal care at [facility] since [redacted] weeks gestation, CNM/MD comanagement  Prenatal course complicated by Mo/Di pregnancy, concordant growth, [name] that started developing at 22 wks.  Conservative management until worsened earlier this week, attempted sono guided draining outpatient and unsuccessful today.   Prenatal Labs: ABO, Rh:   O neg (Rhogam rcvd at 28 wks) Antibody: POS (01/25 1801)NEG Rubella:   IMM RPR:   NR HBsAg:   neg HIV:   neg GBS:   unknown 1 hr Glucola : 99   Medical / Surgical History :  Past medical history:  Past Medical History:  Diagnosis Date  . Medical history non-contributory      Past surgical history:  Past Surgical History:  Procedure Laterality Date  . arm surgery    . HAND SURGERY    . NECK SURGERY    . WISDOM TOOTH EXTRACTION       Family History: History reviewed. No pertinent family history.   Social History:  reports that  has never smoked. she has never used smokeless tobacco. She reports that she does not drink alcohol or use drugs.   Allergies: Patient has no known allergies.    Current Medications at time of admission:  PNV, baby ASA, oral ABX Keflex 500 mg q 6 hrs    Review of Systems: Review of Systems  Constitutional: Positive for chills. Negative for fever.  HENT: Negative.   Eyes: Negative.   Respiratory: Negative.   Cardiovascular: Negative.   Gastrointestinal: Negative.   Genitourinary: Negative.   Musculoskeletal: Negative.   Skin:       Redness and pain in in breast  Neurological: Negative.   Psychiatric/Behavioral: Negative.    + FM, no LOF, no VB. + ctx, pressure, no  pain  Physical Exam:  Vitals:   01/25/17 2307 01/25/17 2336 01/26/17 0539 01/26/17 0753  BP: 108/62 99/63 (!) 97/59 108/68  Pulse: 72 76 61 (!) 59  Resp: 16 16 15 16   Temp: 97.8 F (36.6 C) 98.9 F (37.2 C) 97.8 F (36.6 C) 98.1 F (36.7 C)  TempSrc: Oral Oral Oral Oral  SpO2:  97% 98% 98%  Weight:      Height:        General: AAO x 3, NAD Heart: RRR Lungs: CTAB Breast: R side normal, L side red, lump 2 in diameter underside of breast at 1800, serosanguinous drainage from puncture site minimal Abdomen: gravid, NT Extremities: no edema Genitalia / VE: deferred FHR: Twin A 135, mod var, + accels, no decels  Twin B difficult to trace, lots of FM, FHR 140's TOCO: ctx mild irregular  Labs:    CBC    Component Value Date/Time   WBC 14.4 (H) 01/25/2017 1801   RBC 4.04 01/25/2017 1801   HGB 12.7 01/25/2017 1801   HCT 36.5 01/25/2017 1801   PLT 168 01/25/2017 1801   MCV 90.3 01/25/2017 1801   MCH 31.4 01/25/2017 1801   MCHC 34.8 01/25/2017 1801   RDW 12.9 01/25/2017 1801        Assessment/Plan:  31 y.o. G1P0 at [redacted]w[redacted]d, Mo Di Twins with concordant growth L breast  Abscess-unable to be drained as noted and  enlarging  Admit to antepartum Surgical consult today., Discussed surgical intervention. IV Vancomycin per surgeon's preference, DC oral Keflex NST daily, FHT q shift IS saline lock, LR at 125 with ABX infusion Regular diet Ambien for sleep Add daily probiotic - yeast rebound prevention TED hose while inpatient Continue PNV, baby ASA   Lenoard AdenAAVON,Raveena Hebdon J CNM, MSN 01/26/2017, 8:26 AM

## 2017-01-26 NOTE — Anesthesia Postprocedure Evaluation (Signed)
Anesthesia Post Note  Patient: Hailey Davis  Procedure(s) Performed: INCISION AND DRAINAGE ABSCESS (Left Breast)     Patient location during evaluation: PACU Anesthesia Type: General Level of consciousness: awake and alert Pain management: pain level controlled Vital Signs Assessment: post-procedure vital signs reviewed and stable Respiratory status: spontaneous breathing, nonlabored ventilation, respiratory function stable and patient connected to nasal cannula oxygen Cardiovascular status: blood pressure returned to baseline and stable Postop Assessment: no apparent nausea or vomiting Anesthetic complications: no    Last Vitals:  Vitals:   01/26/17 1720 01/26/17 1730  BP:  99/69  Pulse:  78  Resp: 18 16  Temp: (!) 36.3 C   SpO2:  99%    Last Pain:  Vitals:   01/26/17 1720  TempSrc:   PainSc: 0-No pain                 Luetta Piazza

## 2017-01-26 NOTE — Anesthesia Preprocedure Evaluation (Addendum)
Anesthesia Evaluation  Patient identified by MRN, date of birth, ID band Patient awake    Reviewed: Allergy & Precautions, NPO status , Patient's Chart, lab work & pertinent test results  Airway Mallampati: II  TM Distance: >3 FB Neck ROM: Full    Dental no notable dental hx.    Pulmonary neg pulmonary ROS,    Pulmonary exam normal breath sounds clear to auscultation       Cardiovascular negative cardio ROS Normal cardiovascular exam Rhythm:Regular Rate:Normal     Neuro/Psych negative neurological ROS  negative psych ROS   GI/Hepatic negative GI ROS, Neg liver ROS,   Endo/Other  negative endocrine ROS  Renal/GU negative Renal ROS  negative genitourinary   Musculoskeletal negative musculoskeletal ROS (+)   Abdominal   Peds negative pediatric ROS (+)  Hematology negative hematology ROS (+)   Anesthesia Other Findings   Reproductive/Obstetrics negative OB ROS                             Anesthesia Physical Anesthesia Plan  ASA: II and emergent  Anesthesia Plan: General   Post-op Pain Management:    Induction: Intravenous and Cricoid pressure planned  PONV Risk Score and Plan:   Airway Management Planned: Oral ETT  Additional Equipment:   Intra-op Plan:   Post-operative Plan: Extubation in OR  Informed Consent: I have reviewed the patients History and Physical, chart, labs and discussed the procedure including the risks, benefits and alternatives for the proposed anesthesia with the patient or authorized representative who has indicated his/her understanding and acceptance.   Dental advisory given  Plan Discussed with: CRNA and Anesthesiologist  Anesthesia Plan Comments: (  )       Anesthesia Quick Evaluation

## 2017-01-27 ENCOUNTER — Encounter (HOSPITAL_COMMUNITY): Payer: Self-pay | Admitting: General Surgery

## 2017-01-27 LAB — VANCOMYCIN, TROUGH: Vancomycin Tr: 14 ug/mL — ABNORMAL LOW (ref 15–20)

## 2017-01-27 MED ORDER — MENTHOL 3 MG MT LOZG: 1.0000 | LOZENGE | OROMUCOSAL | Status: DC | PRN

## 2017-01-27 MED ORDER — RISAQUAD PO CAPS: 1.0000 | ORAL_CAPSULE | Freq: Two times a day (BID) | ORAL | Status: DC

## 2017-01-27 MED ORDER — ACETAMINOPHEN 325 MG PO TABS: 1000.0000 mg | ORAL_TABLET | Freq: Four times a day (QID) | ORAL | Status: DC | PRN

## 2017-01-27 MED ORDER — OXYCODONE HCL 5 MG PO TABS: 5.0000 mg | ORAL_TABLET | ORAL | 0 refills | Status: DC | PRN

## 2017-01-27 NOTE — Progress Notes (Signed)
Patient ID: Hailey Davis, female   DOB: 10/21/1985, 32 y.o.   MRN: [id number] 1 Day Post-Op   Subjective: No C/O, breast just sore.  Wants to go home.  Objective: Vital signs in last 24 hours: Temp:  [97.4 F (36.3 C)-98.6 F (37 C)] 97.7 F (36.5 C) (01/27 1149) Pulse Rate:  [66-86] 71 (01/27 1149) Resp:  [15-20] 18 (01/27 1149) BP: (98-112)/(49-69) 101/52 (01/27 1149) SpO2:  [92 %-100 %] 95 % (01/27 1149) Last BM Date: 01/26/17  Intake/Output from previous day: 01/26 0701 - 01/27 0700 In: 970 [P.O.:20; I.V.:950] Out: 3 [Blood:3] Intake/Output this shift: No intake/output data recorded.   Gen'l: Alert and comfortable Left breast with much reduced swelling and erythema, soft.  Packing in place with expected drainage  Lab Results:  Recent Labs    01/25/17 1801  WBC 14.4*  HGB 12.7  HCT 36.5  PLT 168   BMET Recent Labs    01/25/17 1702  CREATININE 0.70   Recent Results (from the past 240 hour(s))  Aerobic/Anaerobic Culture (surgical/deep wound)     Status: None (Preliminary result)   Collection Time: 01/26/17  4:55 PM  Result Value Ref Range Status   Specimen Description BREAST ABCESS LEFT BREAST  Final   Special Requests NONE  Final   Gram Stain   Final    FEW WBC PRESENT, PREDOMINANTLY PMN RARE GRAM POSITIVE COCCI Performed at [facility], [address] [place], [place] [address]    Culture PENDING  Incomplete   Report Status PENDING  Incomplete    Studies/Results: No results found.  Anti-infectives: Anti-infectives (From admission, onward)   Start     Dose/Rate Route Frequency Ordered Stop   01/25/17 1800  vancomycin (VANCOCIN) IVPB 1000 mg/200 mL premix     1,000 mg 200 mL/hr over 60 Minutes Intravenous Every 8 hours 01/25/17 1737        Assessment/Plan: s/p Procedure(s): INCISION AND DRAINAGE ABSCESS left breast Doing well, OK for discharge.  Continue oral Clindamycin and await Cxs Pt to F/U at office tomorrow to remove  packing    LOS: 2 days    Mariella SaaBenjamin T Lonzell Dorris 01/27/2017

## 2017-01-27 NOTE — Anesthesia Postprocedure Evaluation (Signed)
Anesthesia Post Note  Patient: Hailey Davis  Procedure(s) Performed: INCISION AND DRAINAGE ABSCESS (Left Breast)     Patient location during evaluation: [facility] Anesthesia Type: General Level of consciousness: awake and alert, oriented and patient cooperative Pain management: pain level controlled Vital Signs Assessment: post-procedure vital signs reviewed and stable Respiratory status: spontaneous breathing Cardiovascular status: stable Postop Assessment: no signs of nausea or vomiting and adequate PO intake Anesthetic complications: no Comments: Pain score 1.      Last Vitals:  Vitals:   01/26/17 1954 01/27/17 0634  BP:  (!) 98/49  Pulse: 86 66  Resp: 20 15  Temp: 37 C 36.8 C  SpO2: 93% 94%    Last Pain:  Vitals:   01/27/17 0634  TempSrc: Oral  PainSc: Asleep   Pain Goal: Patients Stated Pain Goal: 2 (01/26/17 1945)               Hailey Davis,Hailey Davis

## 2017-01-27 NOTE — Discharge Summary (Signed)
    OB Discharge Summary     Patient Name: Hailey Davis DOB: 05/22/1985 MRN: [id number]  Date of admission: 01/25/2017  Date of discharge: 01/27/2017  Admitting diagnosis: 34wks Cyst Intrauterine pregnancy: 8253w2d     Secondary diagnosis:  Principal Problem:   Breast abscess during pregnancy, antepartum Active Problems:   Monochorionic diamniotic twin gestation in third trimester  Discharge diagnosis: Twin gestation 5353w2d, monochorionic/diamniotic and post -op day 1 for left breast abscess surgically drained                                                                                             Complications: None  Hospital course:  Patient admitted on 01/25/2017 for planned surgical incision and drainage of left breast abscess. Vancomycin intravenous per protocol was given inpatient. Non-stress test performed daily for fetal well-being assessment. Patient underwent surgical drainage of left breast abscess under general anesthesia, please see operative note for further details. Patient recovered well and remained stable for remainder of hospital course, and was discharged home undelivered on 01/27/2017.  Physical exam  Vitals:   01/26/17 1954 01/27/17 0634 01/27/17 0910 01/27/17 1149  BP:  (!) 98/49 (!) 100/53 (!) 101/52  Pulse: 86 66 69 71  Resp: 20 15 18 18   Temp: 98.6 F (37 C) 98.2 F (36.8 C) 97.8 F (36.6 C) 97.7 F (36.5 C)  TempSrc: Oral Oral Oral Oral  SpO2: 93% 94% 95% 95%  Weight:      Height:       See progress note for PE  Labs: Lab Results  Component Value Date   WBC 14.4 (H) 01/25/2017   HGB 12.7 01/25/2017   HCT 36.5 01/25/2017   MCV 90.3 01/25/2017   PLT 168 01/25/2017   CMP Latest Ref Rng & Units 01/25/2017  Creatinine 0.44 - 1.00 mg/dL 4.090.70    Discharge instruction: per After Visit Summary.  After visit meds:  Allergies as of 01/27/2017   No Known Allergies     Medication List    TAKE these medications   acetaminophen 325 MG  tablet Commonly known as:  TYLENOL Take 3 tablets (975 mg total) by mouth every 6 (six) hours as needed for mild pain, moderate pain or fever.   acidophilus Caps capsule Take 1 capsule by mouth 2 (two) times daily.   aspirin EC 81 MG tablet Take 81 mg by mouth daily.   KEFLEX 500 MG capsule Generic drug:  cephALEXin Take 500 mg by mouth 4 (four) times daily.   oxyCODONE 5 MG immediate release tablet Commonly known as:  Oxy IR/ROXICODONE Take 1 tablet (5 mg total) by mouth every 4 (four) hours as needed for severe pain.   PNV PO Take 1 tablet by mouth 2 (two) times daily.       Diet: routine diet  Activity: Advance as tolerated.    [facility]   01/27/2017 Neta Mendsaniela C Jailynne Opperman, CNM

## 2017-01-27 NOTE — Addendum Note (Signed)
Addendum  created 01/27/17 0839 by Angela AdamWrinkle, Lydian Chavous G, CRNA   Sign clinical note

## 2017-01-27 NOTE — Progress Notes (Signed)
HD # 3/POD # 1 [name] 6172w2d, S/P surgical I&D of [name]  S: Feels well this morning, some throat soreness from intubation and coughing. Denies N/V. Has not been up much except for BR trips. No dizziness with ambulation, feels SOB.  Pain controlled w/ APAP and oxycodone. No LOF/VB/painful ctx. Good FM.  O: Vitals:   01/27/17 0910 01/27/17 1149  BP: (!) 100/53 (!) 101/52  Pulse: 69 71  Resp: 18 18  Temp: 97.8 F (36.6 C) 97.7 F (36.5 C)  SpO2: 95% 95%    FHT category 1 x 2 Toco: irregular, mild  Gen: AAO x 3, NAD Breast: L breast dressing soiled and loose, serosanguinous drainage. Changed with new sterile 4x4 and Hypafix tape. Packing left in place per surgeon instructions.  Abd: gravid, NT, S>D GU: deferred Ext: no edema, no cords or tenderness  A/P:  5234 W Mono Di Twin Gestation s/p ID Breast abcess without complications  - FHT cat 1 x 2 - last dose Vancomycin IV infusing - patient encouraged up in [place] to ambulate and tolerating well - gen surgery visit completed this am (no note in chart yet), patient may be DC ed home today with f/u in office tomorrow for packing change. Continue oral ABX as previous until cultures resulted. - F/U OB visit tomorrow morning as scheduled  POC discussed w/ Dr. Billy Coastaavon.  Neta Mendsaniela C Samanda Buske, MSN, CNM 01/27/2017, 12:10 PM

## 2017-01-27 NOTE — Progress Notes (Signed)
Pharmacy Antibiotic Note  Hailey Davis is a 32 y.o. female admitted on 01/25/2017 with Left breast abscess and 34 week twin gestation.  Pharmacy has been consulted for Vancomycin dosing after failed outpatient Bactrim therapy and failed u/s guided aspiration.   Vancomycin trough at goal.  No change.  Noted plans for d/c home today on keflex.  Plan: Continue Vancomycin 1000mg  IV q8h   Height: 5\' 5"  (165.1 cm) Weight: 198 lb (89.8 kg) IBW/kg (Calculated) : 57  Temp (24hrs), Avg:97.9 F (36.6 C), Min:97.4 F (36.3 C), Max:98.6 F (37 C)  Recent Labs  Lab 01/25/17 1702 01/25/17 1801 01/27/17 0927  WBC  --  14.4*  --   CREATININE 0.70  --   --   VANCOTROUGH  --   --  14*    Estimated Creatinine Clearance: 113.8 mL/min (by C-G formula based on SCr of 0.7 mg/dL).    No Known Allergies    Thank you for allowing pharmacy to be a part of this patient's care.  Hailey Davis, Hailey Davis 01/27/2017 12:41 PM

## 2017-01-27 NOTE — Progress Notes (Signed)
Patient ID: Hailey Davis, female   DOB: Aug 19, 1985, 32 y.o.   MRN: [id number] HD #2 Mono Di twins 34 1/7 weeks  S: Good FM Post op pain controlled Occ Contractions, NO LOF  O: BP (!) 98/49 (BP Location: Right Arm) Comment: notified nurse  Pulse 66   Temp 98.2 F (36.8 C) (Oral)   Resp 15   Ht 5\' 5"  (1.651 m)   Wt 89.8 kg (198 lb)   LMP 05/24/2016   SpO2 94%   BMI 32.95 kg/m   NCAT Lungs: CTA CV: RRR Abd: gravid , NT Ext: neg cords Skin Intact Neuro : non focal  FHR Category One tracing A and B Irregular contractions  A: 34 W Mono Di Twin Gestation s/p ID Breast abcess without complications  P: DC home in am per [facility] Has OB appt on Monday 1/28 [facility]

## 2017-01-29 LAB — TYPE AND SCREEN
ABO/RH(D): O NEG
ANTIBODY SCREEN: POSITIVE
UNIT DIVISION: 0
Unit division: 0

## 2017-01-29 LAB — BPAM RBC
BLOOD PRODUCT EXPIRATION DATE: 201902252359
Blood Product Expiration Date: 201902252359
UNIT TYPE AND RH: 9500
Unit Type and Rh: 9500

## 2017-01-30 ENCOUNTER — Other Ambulatory Visit: Payer: Self-pay | Admitting: Obstetrics and Gynecology

## 2017-01-31 LAB — AEROBIC/ANAEROBIC CULTURE (SURGICAL/DEEP WOUND)

## 2017-01-31 LAB — AEROBIC/ANAEROBIC CULTURE W GRAM STAIN (SURGICAL/DEEP WOUND)

## 2017-02-14 ENCOUNTER — Telehealth (HOSPITAL_COMMUNITY): Payer: Self-pay | Admitting: *Deleted

## 2017-02-14 ENCOUNTER — Encounter (HOSPITAL_COMMUNITY): Payer: Self-pay | Admitting: *Deleted

## 2017-02-14 NOTE — Telephone Encounter (Signed)
Preadmission screen  

## 2017-02-20 MED ORDER — MISOPROSTOL 25 MCG QUARTER TABLET
25.0000 ug | ORAL_TABLET | ORAL | Status: DC
  Administered 2017-02-21: 25 ug via VAGINAL
  Filled 2017-02-20 (×2): qty 1

## 2017-02-21 ENCOUNTER — Encounter (HOSPITAL_COMMUNITY): Payer: Self-pay

## 2017-02-21 ENCOUNTER — Inpatient Hospital Stay (HOSPITAL_COMMUNITY): Payer: 59 | Admitting: Anesthesiology

## 2017-02-21 ENCOUNTER — Inpatient Hospital Stay (HOSPITAL_COMMUNITY)
Admission: RE | Admit: 2017-02-21 | Discharge: 2017-02-24 | DRG: 787 | Disposition: A | Discharge status: Home / Self Care | Payer: 59 | Source: Ambulatory Visit | Attending: Obstetrics and Gynecology | Admitting: Obstetrics and Gynecology

## 2017-02-21 ENCOUNTER — Encounter (HOSPITAL_COMMUNITY)
Admission: RE | Disposition: A | Discharge status: Home / Self Care | Payer: Self-pay | Source: Ambulatory Visit | Attending: Obstetrics and Gynecology

## 2017-02-21 DIAGNOSIS — O99344 Other mental disorders complicating childbirth: Secondary | ICD-10-CM | POA: Diagnosis present

## 2017-02-21 DIAGNOSIS — Z3A37 37 weeks gestation of pregnancy: Secondary | ICD-10-CM | POA: Diagnosis not present

## 2017-02-21 DIAGNOSIS — O9912 Other diseases of the blood and blood-forming organs and certain disorders involving the immune mechanism complicating childbirth: Secondary | ICD-10-CM | POA: Diagnosis present

## 2017-02-21 DIAGNOSIS — F419 Anxiety disorder, unspecified: Secondary | ICD-10-CM | POA: Diagnosis present

## 2017-02-21 DIAGNOSIS — D6959 Other secondary thrombocytopenia: Secondary | ICD-10-CM | POA: Diagnosis present

## 2017-02-21 DIAGNOSIS — O30033 Twin pregnancy, monochorionic/diamniotic, third trimester: Secondary | ICD-10-CM | POA: Diagnosis present

## 2017-02-21 LAB — CBC
HCT: 37.2 % (ref 36.0–46.0)
HEMATOCRIT: 37.7 % (ref 36.0–46.0)
HEMATOCRIT: 37.9 % (ref 36.0–46.0)
HEMOGLOBIN: 13.1 g/dL (ref 12.0–15.0)
Hemoglobin: 13.4 g/dL (ref 12.0–15.0)
Hemoglobin: 13.4 g/dL (ref 12.0–15.0)
MCH: 31.6 pg (ref 26.0–34.0)
MCH: 31.4 pg (ref 26.0–34.0)
MCH: 31 pg (ref 26.0–34.0)
MCHC: 36 g/dL (ref 30.0–36.0)
MCHC: 35.5 g/dL (ref 30.0–36.0)
MCHC: 34.6 g/dL (ref 30.0–36.0)
MCV: 87.7 fL (ref 78.0–100.0)
MCV: 88.3 fL (ref 78.0–100.0)
MCV: 89.6 fL (ref 78.0–100.0)
PLATELETS: 133 10*3/uL — AB (ref 150–400)
Platelets: 113 10*3/uL — ABNORMAL LOW (ref 150–400)
Platelets: 122 10*3/uL — ABNORMAL LOW (ref 150–400)
RBC: 4.24 MIL/uL (ref 3.87–5.11)
RBC: 4.27 MIL/uL (ref 3.87–5.11)
RBC: 4.23 MIL/uL (ref 3.87–5.11)
RDW: 12.9 % (ref 11.5–15.5)
RDW: 12.9 % (ref 11.5–15.5)
RDW: 13 % (ref 11.5–15.5)
WBC: 11.6 10*3/uL — ABNORMAL HIGH (ref 4.0–10.5)
WBC: 14.5 10*3/uL — AB (ref 4.0–10.5)
WBC: 14.9 10*3/uL — AB (ref 4.0–10.5)

## 2017-02-21 LAB — OB RESULTS CONSOLE GBS: STREP GROUP B AG: NEGATIVE

## 2017-02-21 LAB — RPR: RPR Ser Ql: NONREACTIVE

## 2017-02-21 SURGERY — Surgical Case
Anesthesia: Epidural

## 2017-02-21 MED ORDER — OXYCODONE-ACETAMINOPHEN 5-325 MG PO TABS
2.0000 | ORAL_TABLET | ORAL | Status: DC | PRN
  Administered 2017-02-23 – 2017-02-24 (×6): 2 via ORAL
  Filled 2017-02-21 (×6): qty 2

## 2017-02-21 MED ORDER — FENTANYL 2.5 MCG/ML BUPIVACAINE 1/10 % EPIDURAL INFUSION (WH - ANES)
INTRAMUSCULAR | Status: AC
  Filled 2017-02-21: qty 100

## 2017-02-21 MED ORDER — MORPHINE SULFATE (PF) 0.5 MG/ML IJ SOLN
INTRAMUSCULAR | Status: DC | PRN
  Administered 2017-02-21: 4 mg via EPIDURAL

## 2017-02-21 MED ORDER — EPHEDRINE 5 MG/ML INJ: 10.0000 mg | INTRAVENOUS | Status: DC | PRN

## 2017-02-21 MED ORDER — FENTANYL CITRATE (PF) 100 MCG/2ML IJ SOLN
INTRAMUSCULAR | Status: AC
  Filled 2017-02-21: qty 2

## 2017-02-21 MED ORDER — ACETAMINOPHEN 160 MG/5ML PO SOLN: 325.0000 mg | ORAL | Status: DC | PRN

## 2017-02-21 MED ORDER — SODIUM CHLORIDE 0.9% FLUSH: 3.0000 mL | INTRAVENOUS | Status: DC | PRN

## 2017-02-21 MED ORDER — DIBUCAINE 1 % RE OINT: 1.0000 "application " | TOPICAL_OINTMENT | RECTAL | Status: DC | PRN

## 2017-02-21 MED ORDER — OXYTOCIN 10 UNIT/ML IJ SOLN
INTRAMUSCULAR | Status: AC
  Filled 2017-02-21: qty 4

## 2017-02-21 MED ORDER — OXYTOCIN 40 UNITS IN LACTATED RINGERS INFUSION - SIMPLE MED: 2.5000 [IU]/h | INTRAVENOUS | Status: AC

## 2017-02-21 MED ORDER — METHYLERGONOVINE MALEATE 0.2 MG/ML IJ SOLN: 0.2000 mg | INTRAMUSCULAR | Status: DC | PRN

## 2017-02-21 MED ORDER — OXYTOCIN 10 UNIT/ML IJ SOLN
INTRAMUSCULAR | Status: DC | PRN
  Administered 2017-02-21: 40 [IU] via INTRAVENOUS

## 2017-02-21 MED ORDER — METHYLERGONOVINE MALEATE 0.2 MG PO TABS: 0.2000 mg | ORAL_TABLET | ORAL | Status: DC | PRN

## 2017-02-21 MED ORDER — BUTORPHANOL TARTRATE 1 MG/ML IJ SOLN
INTRAMUSCULAR | Status: AC
  Filled 2017-02-21: qty 2

## 2017-02-21 MED ORDER — HYDROXYZINE HCL 50 MG PO TABS: 50.0000 mg | ORAL_TABLET | Freq: Every day | ORAL | Status: DC

## 2017-02-21 MED ORDER — PHENYLEPHRINE 40 MCG/ML (10ML) SYRINGE FOR IV PUSH (FOR BLOOD PRESSURE SUPPORT)
80.0000 ug | PREFILLED_SYRINGE | INTRAVENOUS | Status: DC | PRN
  Administered 2017-02-21: 80 ug via INTRAVENOUS

## 2017-02-21 MED ORDER — LACTATED RINGERS IV SOLN
INTRAVENOUS | Status: DC | PRN
  Administered 2017-02-21: 19:00:00 via INTRAVENOUS

## 2017-02-21 MED ORDER — SIMETHICONE 80 MG PO CHEW
80.0000 mg | CHEWABLE_TABLET | ORAL | Status: DC
  Administered 2017-02-21 – 2017-02-23 (×3): 80 mg via ORAL
  Filled 2017-02-21 (×3): qty 1

## 2017-02-21 MED ORDER — LACTATED RINGERS IV SOLN
500.0000 mL | Freq: Once | INTRAVENOUS | Status: AC
  Administered 2017-02-21: 500 mL via INTRAVENOUS

## 2017-02-21 MED ORDER — LIDOCAINE-EPINEPHRINE (PF) 2 %-1:200000 IJ SOLN
INTRAMUSCULAR | Status: AC
  Filled 2017-02-21: qty 20

## 2017-02-21 MED ORDER — WITCH HAZEL-GLYCERIN EX PADS: 1.0000 "application " | MEDICATED_PAD | CUTANEOUS | Status: DC | PRN

## 2017-02-21 MED ORDER — OXYTOCIN BOLUS FROM INFUSION: 500.0000 mL | Freq: Once | INTRAVENOUS | Status: DC

## 2017-02-21 MED ORDER — BUPIVACAINE HCL (PF) 0.25 % IJ SOLN
INTRAMUSCULAR | Status: AC
  Filled 2017-02-21: qty 30

## 2017-02-21 MED ORDER — SIMETHICONE 80 MG PO CHEW
80.0000 mg | CHEWABLE_TABLET | Freq: Three times a day (TID) | ORAL | Status: DC
  Administered 2017-02-22 – 2017-02-24 (×4): 80 mg via ORAL
  Filled 2017-02-21 (×5): qty 1

## 2017-02-21 MED ORDER — LIDOCAINE HCL (PF) 1 % IJ SOLN: 30.0000 mL | INTRAMUSCULAR | Status: DC | PRN

## 2017-02-21 MED ORDER — DEXAMETHASONE SODIUM PHOSPHATE 10 MG/ML IJ SOLN
INTRAMUSCULAR | Status: DC | PRN
  Administered 2017-02-21: 10 mg via INTRAVENOUS

## 2017-02-21 MED ORDER — DIPHENHYDRAMINE HCL 50 MG/ML IJ SOLN: 12.5000 mg | INTRAMUSCULAR | Status: DC | PRN

## 2017-02-21 MED ORDER — TERBUTALINE SULFATE 1 MG/ML IJ SOLN: 0.2500 mg | Freq: Once | INTRAMUSCULAR | Status: DC | PRN

## 2017-02-21 MED ORDER — PRENATAL MULTIVITAMIN CH
1.0000 | ORAL_TABLET | Freq: Every day | ORAL | Status: DC
  Administered 2017-02-22 – 2017-02-24 (×3): 1 via ORAL
  Filled 2017-02-21 (×3): qty 1

## 2017-02-21 MED ORDER — SIMETHICONE 80 MG PO CHEW: 80.0000 mg | CHEWABLE_TABLET | ORAL | Status: DC | PRN

## 2017-02-21 MED ORDER — ONDANSETRON HCL 4 MG/2ML IJ SOLN: 4.0000 mg | Freq: Once | INTRAMUSCULAR | Status: DC | PRN

## 2017-02-21 MED ORDER — BUTORPHANOL TARTRATE 1 MG/ML IJ SOLN
2.0000 mg | Freq: Once | INTRAMUSCULAR | Status: AC
  Administered 2017-02-21: 2 mg via INTRAVENOUS

## 2017-02-21 MED ORDER — PHENYLEPHRINE 40 MCG/ML (10ML) SYRINGE FOR IV PUSH (FOR BLOOD PRESSURE SUPPORT): 80.0000 ug | PREFILLED_SYRINGE | INTRAVENOUS | Status: DC | PRN

## 2017-02-21 MED ORDER — OXYTOCIN 40 UNITS IN LACTATED RINGERS INFUSION - SIMPLE MED
1.0000 m[IU]/min | INTRAVENOUS | Status: DC
  Administered 2017-02-21: 2 m[IU]/min via INTRAVENOUS

## 2017-02-21 MED ORDER — LACTATED RINGERS IV SOLN: 500.0000 mL | INTRAVENOUS | Status: DC | PRN

## 2017-02-21 MED ORDER — IBUPROFEN 600 MG PO TABS
600.0000 mg | ORAL_TABLET | Freq: Four times a day (QID) | ORAL | Status: DC
  Administered 2017-02-21 – 2017-02-24 (×10): 600 mg via ORAL
  Filled 2017-02-21 (×11): qty 1

## 2017-02-21 MED ORDER — SODIUM CHLORIDE 0.9 % IV SOLN: 250.0000 mL | INTRAVENOUS | Status: DC | PRN

## 2017-02-21 MED ORDER — OXYCODONE-ACETAMINOPHEN 5-325 MG PO TABS
2.0000 | ORAL_TABLET | ORAL | Status: DC | PRN
Start: 2017-02-21 — End: 2017-02-21

## 2017-02-21 MED ORDER — CEFAZOLIN SODIUM-DEXTROSE 2-3 GM-%(50ML) IV SOLR
INTRAVENOUS | Status: DC | PRN
  Administered 2017-02-21: 2 g via INTRAVENOUS

## 2017-02-21 MED ORDER — LIDOCAINE HCL (PF) 1 % IJ SOLN
INTRAMUSCULAR | Status: DC | PRN
  Administered 2017-02-21: 6 mL via EPIDURAL

## 2017-02-21 MED ORDER — ONDANSETRON HCL 4 MG/2ML IJ SOLN
4.0000 mg | Freq: Four times a day (QID) | INTRAMUSCULAR | Status: DC
  Administered 2017-02-21: 4 mg via INTRAVENOUS
  Filled 2017-02-21: qty 2

## 2017-02-21 MED ORDER — MENTHOL 3 MG MT LOZG: 1.0000 | LOZENGE | OROMUCOSAL | Status: DC | PRN

## 2017-02-21 MED ORDER — ACETAMINOPHEN 325 MG PO TABS: 325.0000 mg | ORAL_TABLET | ORAL | Status: DC | PRN

## 2017-02-21 MED ORDER — OXYCODONE HCL 5 MG PO TABS: 5.0000 mg | ORAL_TABLET | Freq: Once | ORAL | Status: DC | PRN

## 2017-02-21 MED ORDER — OXYCODONE-ACETAMINOPHEN 5-325 MG PO TABS: 1.0000 | ORAL_TABLET | ORAL | Status: DC | PRN

## 2017-02-21 MED ORDER — OXYCODONE HCL 5 MG/5ML PO SOLN: 5.0000 mg | Freq: Once | ORAL | Status: DC | PRN

## 2017-02-21 MED ORDER — KETOROLAC TROMETHAMINE 30 MG/ML IJ SOLN: 30.0000 mg | Freq: Once | INTRAMUSCULAR | Status: DC | PRN

## 2017-02-21 MED ORDER — ACETAMINOPHEN 325 MG PO TABS: 650.0000 mg | ORAL_TABLET | ORAL | Status: DC | PRN

## 2017-02-21 MED ORDER — LACTATED RINGERS IV SOLN: 500.0000 mL | Freq: Once | INTRAVENOUS | Status: DC

## 2017-02-21 MED ORDER — LACTATED RINGERS IV SOLN
INTRAVENOUS | Status: DC
  Administered 2017-02-21 (×2): via INTRAVENOUS

## 2017-02-21 MED ORDER — COCONUT OIL OIL: 1.0000 "application " | TOPICAL_OIL | Status: DC | PRN

## 2017-02-21 MED ORDER — TETANUS-DIPHTH-ACELL PERTUSSIS 5-2.5-18.5 LF-MCG/0.5 IM SUSP: 0.5000 mL | Freq: Once | INTRAMUSCULAR | Status: DC

## 2017-02-21 MED ORDER — PHENYLEPHRINE 40 MCG/ML (10ML) SYRINGE FOR IV PUSH (FOR BLOOD PRESSURE SUPPORT)
PREFILLED_SYRINGE | INTRAVENOUS | Status: AC
  Filled 2017-02-21: qty 10

## 2017-02-21 MED ORDER — LACTATED RINGERS IV SOLN
INTRAVENOUS | Status: DC | PRN
  Administered 2017-02-21 (×3): via INTRAVENOUS

## 2017-02-21 MED ORDER — OXYCODONE-ACETAMINOPHEN 5-325 MG PO TABS
1.0000 | ORAL_TABLET | ORAL | Status: DC | PRN
  Administered 2017-02-22: 1 via ORAL
  Filled 2017-02-21: qty 1

## 2017-02-21 MED ORDER — ONDANSETRON HCL 4 MG/2ML IJ SOLN
INTRAMUSCULAR | Status: AC
  Filled 2017-02-21: qty 2

## 2017-02-21 MED ORDER — FENTANYL 2.5 MCG/ML BUPIVACAINE 1/10 % EPIDURAL INFUSION (WH - ANES)
14.0000 mL/h | INTRAMUSCULAR | Status: DC | PRN
  Administered 2017-02-21: 14 mL/h via EPIDURAL

## 2017-02-21 MED ORDER — MEPERIDINE HCL 25 MG/ML IJ SOLN
INTRAMUSCULAR | Status: AC
  Filled 2017-02-21: qty 1

## 2017-02-21 MED ORDER — FENTANYL CITRATE (PF) 100 MCG/2ML IJ SOLN
INTRAMUSCULAR | Status: DC | PRN
  Administered 2017-02-21: 100 ug via EPIDURAL
  Administered 2017-02-21: 100 ug via INTRAVENOUS

## 2017-02-21 MED ORDER — MEPERIDINE HCL 25 MG/ML IJ SOLN: 6.2500 mg | INTRAMUSCULAR | Status: DC | PRN

## 2017-02-21 MED ORDER — ONDANSETRON HCL 4 MG/2ML IJ SOLN
INTRAMUSCULAR | Status: DC | PRN
  Administered 2017-02-21: 4 mg via INTRAVENOUS

## 2017-02-21 MED ORDER — SCOPOLAMINE 1 MG/3DAYS TD PT72
MEDICATED_PATCH | TRANSDERMAL | Status: AC
  Filled 2017-02-21: qty 1

## 2017-02-21 MED ORDER — DIPHENHYDRAMINE HCL 25 MG PO CAPS: 25.0000 mg | ORAL_CAPSULE | Freq: Four times a day (QID) | ORAL | Status: DC | PRN

## 2017-02-21 MED ORDER — SODIUM CHLORIDE 0.9% FLUSH: 3.0000 mL | Freq: Two times a day (BID) | INTRAVENOUS | Status: DC

## 2017-02-21 MED ORDER — FENTANYL CITRATE (PF) 100 MCG/2ML IJ SOLN: 25.0000 ug | INTRAMUSCULAR | Status: DC | PRN

## 2017-02-21 MED ORDER — SODIUM BICARBONATE 8.4 % IV SOLN
INTRAVENOUS | Status: AC
  Filled 2017-02-21: qty 50

## 2017-02-21 MED ORDER — MORPHINE SULFATE (PF) 0.5 MG/ML IJ SOLN
INTRAMUSCULAR | Status: AC
Start: 2017-02-21 — End: 2017-02-21
  Filled 2017-02-21: qty 10

## 2017-02-21 MED ORDER — OXYTOCIN 40 UNITS IN LACTATED RINGERS INFUSION - SIMPLE MED
2.5000 [IU]/h | INTRAVENOUS | Status: DC
  Filled 2017-02-21: qty 1000

## 2017-02-21 MED ORDER — SENNOSIDES-DOCUSATE SODIUM 8.6-50 MG PO TABS
2.0000 | ORAL_TABLET | ORAL | Status: DC
  Administered 2017-02-21 – 2017-02-23 (×3): 2 via ORAL
  Filled 2017-02-21 (×3): qty 2

## 2017-02-21 MED ORDER — LACTATED RINGERS IV SOLN
INTRAVENOUS | Status: DC
  Administered 2017-02-22: 04:00:00 via INTRAVENOUS

## 2017-02-21 MED ORDER — SCOPOLAMINE 1 MG/3DAYS TD PT72
MEDICATED_PATCH | TRANSDERMAL | Status: DC | PRN
  Administered 2017-02-21: 1 via TRANSDERMAL

## 2017-02-21 MED ORDER — ZOLPIDEM TARTRATE 5 MG PO TABS: 5.0000 mg | ORAL_TABLET | Freq: Every evening | ORAL | Status: DC | PRN

## 2017-02-21 MED ORDER — SODIUM BICARBONATE 8.4 % IV SOLN
INTRAVENOUS | Status: DC | PRN
  Administered 2017-02-21 (×4): 5 mL via EPIDURAL

## 2017-02-21 MED ORDER — SOD CITRATE-CITRIC ACID 500-334 MG/5ML PO SOLN
30.0000 mL | ORAL | Status: DC | PRN
  Administered 2017-02-21: 30 mL via ORAL
  Filled 2017-02-21: qty 15

## 2017-02-21 SURGICAL SUPPLY — 39 items
ADH SKN CLS APL DERMABOND .7 (GAUZE/BANDAGES/DRESSINGS) ×1
APL SKNCLS STERI-STRIP NONHPOA (GAUZE/BANDAGES/DRESSINGS) ×1
BENZOIN TINCTURE PRP APPL 2/3 (GAUZE/BANDAGES/DRESSINGS) ×2 IMPLANT
CHLORAPREP W/TINT 26ML (MISCELLANEOUS) ×3 IMPLANT
CLAMP CORD UMBIL (MISCELLANEOUS) IMPLANT
CLOTH BEACON ORANGE TIMEOUT ST (SAFETY) ×3 IMPLANT
DERMABOND ADVANCED (GAUZE/BANDAGES/DRESSINGS) ×2
DERMABOND ADVANCED .7 DNX12 (GAUZE/BANDAGES/DRESSINGS) IMPLANT
DRSG OPSITE POSTOP 4X10 (GAUZE/BANDAGES/DRESSINGS) ×3 IMPLANT
ELECT REM PT RETURN 9FT ADLT (ELECTROSURGICAL) ×3
ELECTRODE REM PT RTRN 9FT ADLT (ELECTROSURGICAL) ×1 IMPLANT
EXTRACTOR VACUUM M CUP 4 TUBE (SUCTIONS) IMPLANT
EXTRACTOR VACUUM M CUP 4' TUBE (SUCTIONS)
GLOVE BIO SURGEON STRL SZ7.5 (GLOVE) ×3 IMPLANT
GLOVE BIOGEL PI IND STRL 7.0 (GLOVE) ×1 IMPLANT
GLOVE BIOGEL PI INDICATOR 7.0 (GLOVE) ×2
GOWN STRL REUS W/TWL LRG LVL3 (GOWN DISPOSABLE) ×6 IMPLANT
KIT ABG SYR 3ML LUER SLIP (SYRINGE) IMPLANT
NDL HYPO 25X5/8 SAFETYGLIDE (NEEDLE) IMPLANT
NDL SPNL 20GX3.5 QUINCKE YW (NEEDLE) IMPLANT
NEEDLE HYPO 22GX1.5 SAFETY (NEEDLE) ×3 IMPLANT
NEEDLE HYPO 25X5/8 SAFETYGLIDE (NEEDLE) IMPLANT
NEEDLE SPNL 20GX3.5 QUINCKE YW (NEEDLE) IMPLANT
NS IRRIG 1000ML POUR BTL (IV SOLUTION) ×3 IMPLANT
PACK C SECTION WH (CUSTOM PROCEDURE TRAY) ×3 IMPLANT
PENCIL SMOKE EVAC W/HOLSTER (ELECTROSURGICAL) ×3 IMPLANT
SUT MNCRL 0 VIOLET CTX 36 (SUTURE) ×2 IMPLANT
SUT MNCRL AB 3-0 PS2 27 (SUTURE) IMPLANT
SUT MON AB 2-0 CT1 27 (SUTURE) ×3 IMPLANT
SUT MON AB-0 CT1 36 (SUTURE) ×6 IMPLANT
SUT MONOCRYL 0 CTX 36 (SUTURE) ×4
SUT PLAIN 0 NONE (SUTURE) IMPLANT
SUT PLAIN 2 0 (SUTURE)
SUT PLAIN 2 0 XLH (SUTURE) ×2 IMPLANT
SUT PLAIN ABS 2-0 CT1 27XMFL (SUTURE) IMPLANT
SYR 20CC LL (SYRINGE) IMPLANT
SYR CONTROL 10ML LL (SYRINGE) ×3 IMPLANT
TOWEL OR 17X24 6PK STRL BLUE (TOWEL DISPOSABLE) ×3 IMPLANT
TRAY FOLEY BAG SILVER LF 14FR (SET/KITS/TRAYS/PACK) ×3 IMPLANT

## 2017-02-21 NOTE — Anesthesia Postprocedure Evaluation (Signed)
Anesthesia Post Note  Patient: Loney LohCourtney B Baillargeon  Procedure(s) Performed: CESAREAN SECTION (N/A )     Patient location during evaluation: Mother Baby Anesthesia Type: Epidural Level of consciousness: awake and alert Pain management: pain level controlled Vital Signs Assessment: post-procedure vital signs reviewed and stable Respiratory status: spontaneous breathing, nonlabored ventilation and respiratory function stable Cardiovascular status: stable Postop Assessment: no headache, no backache and epidural receding Anesthetic complications: no    Last Vitals:  Vitals:   02/21/17 2146 02/21/17 2246  BP: 110/69 118/61  Pulse: (!) 57 60  Resp: 18 18  Temp: 36.8 C 37.3 C  SpO2: 92% 93%    Last Pain:  Vitals:   02/21/17 2246  TempSrc: Axillary  PainSc: 0-No pain   Pain Goal:                 Shaneice Barsanti

## 2017-02-21 NOTE — Progress Notes (Signed)
S:  No pain with epidural  O:  VS: BP 124/72   Pulse 69   Temp 98.1 F (36.7 C) (Oral)   Resp 17   Ht 5\' 5"  (1.651 m)   Wt 96 kg (211 lb 11.2 oz)   LMP 05/24/2016   BMI 35.23 kg/m         FHR A : baseline 125 / variability moderate / accelerations + / mild variable decelerations        FHR B : baseline 125 / variability moderate / accelerations + / no decelerations        Toco: contractions every 1-2 minutes / 30-60 / MVU 90  (high frequency with low intensity)                    resting tone 25-30 (since IUPC placement)        Cervix : no change / IUPC in place / clear fluid leakage in IUPC        Pitocin 234mu./min - no increase in 2 hours        Membranes: clear        Foley draining dark amber urine - scant amount  A: induction of labor     FHR reassuring - A category 2 / B-category 1     Inadequate uterine activity       P: IVF bolus / encouraged oral water hydration      Increase pitocin to achieve adequate MVU           Report given to Dr Billy Coastaavon and Arlan Organaniela Paul CNM - to follow   Marlinda Mikeanya Masin Shatto CNM, MSN, [facility] 02/21/2017, 4:05 PM

## 2017-02-21 NOTE — Anesthesia Procedure Notes (Addendum)
Epidural Patient location during procedure: OB Start time: 02/21/2017 11:07 AM End time: 02/21/2017 11:20 AM  Staffing Anesthesiologist: Trevor IhaHouser, Stephen A, MD Performed: anesthesiologist   Preanesthetic Checklist Completed: patient identified, site marked, surgical consent, pre-op evaluation, timeout performed, IV checked, risks and benefits discussed and monitors and equipment checked  Epidural Patient position: sitting Prep: site prepped and draped and DuraPrep Patient monitoring: continuous pulse ox and blood pressure Approach: midline Location: L3-L4 Injection technique: LOR air  Needle:  Needle type: Tuohy  Needle gauge: 17 G Needle length: 9 cm and 9 Needle insertion depth: 5 cm cm Catheter type: closed end flexible Catheter size: 19 Gauge Catheter at skin depth: 10 cm Test dose: negative  Assessment Events: blood not aspirated, injection not painful, no injection resistance, negative IV test and no paresthesia

## 2017-02-21 NOTE — Op Note (Signed)
Cesarean Section Procedure Note  Indications: failure to progress: arrest of dilation  Pre-operative Diagnosis: 37 week 6 day pregnancy.  Post-operative Diagnosis: same  Surgeon: Lenoard AdenAAVON,Vestal Markin J   Assistants: Renae Ficklepaul, CNM  Anesthesia: Epidural anesthesia and Local anesthesia 0.25.% bupivacaine  ASA Class: 2  Procedure Details  The patient was seen in the Holding Room. The risks, benefits, complications, treatment options, and expected outcomes were discussed with the patient.  The patient concurred with the proposed plan, giving informed consent. The risks of anesthesia, infection, bleeding and possible injury to other organs discussed. Injury to bowel, bladder, or ureter with possible need for repair discussed. Possible need for transfusion with secondary risks of hepatitis or HIV acquisition discussed. Post operative complications to include but not limited to DVT, PE and Pneumonia noted. The site of surgery properly noted/marked. The patient was taken to Operating Room # 9, identified as Hailey Davis and the procedure verified as C-Section Delivery. A Time Out was held and the above information confirmed.  After induction of anesthesia, the patient was draped and prepped in the usual sterile manner. A Pfannenstiel incision was made and carried down through the subcutaneous tissue to the fascia. Fascial incision was made and extended transversely using Mayo scissors. The fascia was separated from the underlying rectus tissue superiorly and inferiorly. The peritoneum was identified and entered. Peritoneal incision was extended longitudinally. The utero-vesical peritoneal reflection was incised transversely and the bladder flap was bluntly freed from the lower uterine segment. A low transverse uterine incision(Kerr hysterotomy) was made. Delivered from OP presentation was a  Female twin A with Apgar scores of 8 at one minute and 9 at five minutes. Bulb suctioning gently performed. Neonatal team  in attendance.After the umbilical cord was clamped and cut cord blood was obtained for evaluation. Delivered from OA presentation was a  Female twin B with Apgar scores of 8 at one minute and 9 at five minutes. Bulb suctioning gently performed. Neonatal team in attendance.After the umbilical cord was clamped and cut cord blood was obtained for evaluation. The placenta was removed intact and appeared normal. The uterus was curetted with a dry lap pack. Good hemostasis was noted.The uterine outline, tubes and ovaries appeared normal. The uterine incision was closed with running locked sutures of 0 Monocryl x 2 layers. Hemostasis was observed. Lavage was carried out until clear.The parietal peritoneum was closed with a running 2-0 Monocryl suture. The fascia was then reapproximated with running sutures of 0 Monocryl. The skin was reapproximated with 3-0 monocryl after [place] closure with 2-0 plain.  Instrument, sponge, and needle counts were correct prior the abdominal closure and at the conclusion of the case.   Findings: OP twin A, OA twin B. Clear AF.  Estimated Blood Loss:  800         Drains: foley                 Specimens: placenta to path                 Complications:  None; patient tolerated the procedure well.         Disposition: PACU - hemodynamically stable.         Condition: stable  Attending Attestation: I performed the procedure.

## 2017-02-21 NOTE — Anesthesia Pain Management Evaluation Note (Signed)
  CRNA Pain Management Visit Note  Patient: Hailey Davis, 32 y.o., female  "Hello I am a [facility] the [facility]. We have an anesthesia team available at all times to provide care throughout the hospital, including epidural management and anesthesia for C-section. I don't know your plan for the delivery whether it a natural birth, water birth, IV sedation, nitrous supplementation, doula or epidural, but we want to meet your pain goals."   1.Was your pain managed to your expectations on prior hospitalizations?   No prior hospitalizations  2.What is your expectation for pain management during this hospitalization?     Epidural and IV pain meds  3.How can we help you reach that goal? Be available  Record the patient's initial score and the patient's pain goal.   Pain: 0  Pain Goal: 6 [facility] wants you to be able to say your pain was always managed very well.  [facility] HospitalMERRITT,Alyana Kreiter 02/21/2017

## 2017-02-21 NOTE — Progress Notes (Signed)
Hailey Davis is a 32 y.o. G1P0 at 8167w6d by LMP admitted for induction of labor due to Mono Di twins.  Subjective: Comfortable  Objective: BP 122/68   Pulse 64   Temp 98.1 F (36.7 C) (Oral)   Resp 17   Ht 5\' 5"  (1.651 m)   Wt 96 kg (211 lb 11.2 oz)   LMP 05/24/2016   SpO2 96%   BMI 35.23 kg/m  No intake/output data recorded. No intake/output data recorded.  FHT:  FHR: 140-150 bpm, variability: moderate,  accelerations:  Present,  decelerations:  Present rare variable UC:   irregular, every 2 minutes SVE:   Dilation: 4.5 Effacement (%): 90 Station: -2 Exam by:: Dr. Billy Coastaavon No change x 6hrs  Labs: Lab Results  Component Value Date   WBC 14.5 (H) 02/21/2017   HGB 13.4 02/21/2017   HCT 37.7 02/21/2017   MCV 88.3 02/21/2017   PLT 133 (L) 02/21/2017    Assessment / Plan: Protracted active phase  Labor: no progress in active phase Preeclampsia:  no signs or symptoms of toxicity Fetal Wellbeing:  Category I Pain Control:  Epidural I/D:  n/a Anticipated MOD:  No change noted. Discussed changing IUPC vs primary csection. Pt and family discussing options.  Hailey Davis 02/21/2017, 5:36 PM

## 2017-02-21 NOTE — H&P (Signed)
OB ADMISSION/ HISTORY & PHYSICAL:  Admission Date: 02/21/2017 12:05 AM  Admit Diagnosis: 37.6 weeks / monoamniotic - dichorionic twin gestation   Hailey Davis is a 32 y.o. female presenting for induction of labor.  Prenatal History: G1P0 with LMP 05-24-2016 EDC : 03/08/2017, by Ultrasound  [facility]  Primary Ob Provider: Marlinda Mikeanya Joyia Riehle CNM and Dr Billy Coastaavon  Prenatal course complicated by twin gestation / mild thrombocytyopenia (ante nadir 149).  Twin gestation with normal genetic screening, normal fetal echo exams, concordant growth, vtx-vtx presentation. Completed MFM consult and collaborative prenatal visits. Desire for vaginal birth.  Recent breast abscess requiring surgical drainage - completed oral ABX course (IV vancomycin with hospitalization)  Prenatal Labs: ABO, Rh: --/--/O NEG (02/21 0154) Antibody: POS (02/21 0154) Rubella: Immune (09/12 0000)  RPR: Nonreactive (09/12 0000)  HBsAg: Negative (09/12 0000)  HIV: Non-reactive (09/12 0000)  GTT: normal GBS:   negative  Medical / Surgical History :  Past medical history:  Past Medical History:  Diagnosis Date  . Medical history non-contributory   . PONV (postoperative nausea and vomiting)     Past surgical history:  Past Surgical History:  Procedure Laterality Date  . arm surgery    . HAND SURGERY    . INCISION AND DRAINAGE ABSCESS Left 01/26/2017   Procedure: INCISION AND DRAINAGE ABSCESS;  Surgeon: Glenna FellowsHoxworth, Benjamin, MD;  Location: [facility];  Service: General;  Laterality: Left;  I&D of L) Breast abcess  . NECK SURGERY    . WISDOM TOOTH EXTRACTION     Family History:  Family History  Problem Relation Age of Onset  . Breast cancer Maternal Aunt   . Breast cancer Maternal Grandmother   . Heart attack Maternal Grandfather   . Ovarian cancer Paternal Grandmother     Social History:  reports that  has never smoked. she has never used smokeless tobacco. She reports that she does  not drink alcohol or use drugs.  Allergies: Patient has no known allergies.   Current Medications at time of admission:  Prior to Admission medications   Medication Sig Start Date End Date Taking? Authorizing Provider  acetaminophen (TYLENOL) 325 MG tablet Take 3 tablets (975 mg total) by mouth every 6 (six) hours as needed for mild pain, moderate pain or fever. 01/27/17  Yes Arlan OrganPaul, Daniela C, CNM  acidophilus (RISAQUAD) CAPS capsule Take 1 capsule by mouth 2 (two) times daily. 01/27/17  Yes Neta MendsPaul, Daniela C, CNM                Prenatal Vit w/Fe-Methylfol-FA (PNV PO) Take 1 tablet by mouth 2 (two) times daily.    Yes [provider]          Review of Systems: Active FM Irregular ctx  Physical Exam: VS: temp -98.4 -plulse 70 - resp 18 - BP 121/87 General: alert and oriented, appears comfortable Heart: RRR Lungs: Clear lung fields Abdomen: Gravid, soft and non-tender, non-distended / uterus: gravid Extremities: trace pedal  edema  Genitalia / VE: 1cm / 70% / vtx -1 / soft  FHR A: baseline rate 125 / variability moderate / accelerations + / no decelerations FHR B: baseline rate  120 / variability moderate / accelerations + / no decelerations TOCO: 2-8 mild  Assessment: 37.[redacted] weeks gestation with Mo-DI twin gestation Induction of labor with marginal favorable cervix  FHR category 1 x2 Negative GBS Mild thrombocytopenia  Plan: Intrapartum:  Admit Cytotec to cervical balloon (will need analgesia for placement -  intolerance to vaginal exams) Vistaril for anxiety and sleep Place cervical balloon AM Early epidural - will need CBC prior to epidural placement per anesthesia Attempt vaginal birth - MD & CNM co-management / MD at delivery  Dr Billy Coast notified of admission / plan of care  Postpartum: Postpartum risk for engorgement, clogged duct, mastitis, abscess recurrence. Lactation consult PP / close home follow-up   Marlinda Mike CNM, MSN, [facility] 02/21/2017, [phone]

## 2017-02-21 NOTE — Progress Notes (Signed)
S:  slept little - contractions uncomfortable       understands plan of care - cervical balloon placement with nitrous oxide for pain relief - epidural when balloon out       AROM after epidural  O:  VS: BP 124/88   Pulse 93   Temp 97.9 F (36.6 C) (Oral)   Resp 18   Ht 5\' 5"  (1.651 m)   Wt 96 kg (211 lb 11.2 oz)   LMP 05/24/2016   BMI 35.23 kg/m         FHR A : baseline 130 / variability moderate / accelerations + / no decelerations        FHR  B: baseline 120 / variability moderate / accelerations + / no decelerations         Toco: contractions every 2 minutes - high frequency and low intensity        Cervix : tight 2cm / 80% / vtx -1        Membranes: swept prior to placement of cervical balloon          cervical balloon placed under nitrous oxide analgesia with assistance of Arlan Organaniela Paul CNM for coached relaxation.        Hx of difficult exams "always so painful" - nitrous effective for exam and cervical balloon placement with minimal                    difficulty reported pain despite nitrous  A: induction of labor - Cytotec effective for ripening and contraction initiation     FHR category 1 x2  P: cervical balloon - anticipate expulsion within 2-3 hours      ok to shower with EFM off in shower      CBC recheck prior to epidural - plan epidural after balloon out      AROM after epidural - avoid further exams if possible until epidural       IV analgesia PRN  Marlinda Mikeanya Bailey CNM, MSN, [facility] 02/21/2017, 9:48 AM

## 2017-02-21 NOTE — Transfer of Care (Signed)
Immediate Anesthesia Transfer of Care Note  Patient: Hailey Davis  Procedure(s) Performed: CESAREAN SECTION (N/A )  Patient Location: PACU  Anesthesia Type:Epidural  Level of Consciousness: awake, alert , oriented and patient cooperative  Airway & Oxygen Therapy: Patient Spontanous Breathing  Post-op Assessment: Report given to RN and Post -op Vital signs reviewed and stable  Post vital signs: Reviewed and stable  Last Vitals:  Vitals:   02/21/17 1956 02/21/17 1957  BP:    Pulse: 65 65  Resp: (!) 29 (!) 25  Temp:    SpO2: 96% 96%    Last Pain:  Vitals:   02/21/17 1954  TempSrc: (P) Oral  PainSc:          Complications: No apparent anesthesia complications

## 2017-02-21 NOTE — Anesthesia Preprocedure Evaluation (Addendum)
Anesthesia Evaluation  Patient identified by MRN, date of birth, ID band Patient awake    Reviewed: Allergy & Precautions, NPO status , Patient's Chart, lab work & pertinent test results  Airway Mallampati: II  TM Distance: >3 FB Neck ROM: Full    Dental no notable dental hx.    Pulmonary neg pulmonary ROS,    Pulmonary exam normal breath sounds clear to auscultation       Cardiovascular negative cardio ROS Normal cardiovascular exam Rhythm:Regular Rate:Normal     Neuro/Psych    GI/Hepatic negative GI ROS,   Endo/Other    Renal/GU      Musculoskeletal   Abdominal   Peds  Hematology   Anesthesia Other Findings   Reproductive/Obstetrics (+) Pregnancy                            Lab Results  Component Value Date   WBC 14.5 (H) 02/21/2017   HGB 13.4 02/21/2017   HCT 37.7 02/21/2017   MCV 88.3 02/21/2017   PLT 133 (L) 02/21/2017    Anesthesia Physical Anesthesia Plan  ASA: II  Anesthesia Plan: Epidural   Post-op Pain Management:    Induction:   PONV Risk Score and Plan:   Airway Management Planned:   Additional Equipment:   Intra-op Plan:   Post-operative Plan:   Informed Consent: I have reviewed the patients History and Physical, chart, labs and discussed the procedure including the risks, benefits and alternatives for the proposed anesthesia with the patient or authorized representative who has indicated his/her understanding and acceptance.     Plan Discussed with: CRNA  Anesthesia Plan Comments:         Anesthesia Quick Evaluation

## 2017-02-21 NOTE — Progress Notes (Signed)
S: resting comfortable after IV analgesia, ctx less intense, now sleepy.   O: VSSAF FHR A - 120, mod var, + accels, no decels          B - 110's, mod var, + accels, no decels Toco q 2-4 min, mild  GU deferred  A/P: Twin IUP mo/di FHT cat 1 x 2 Cervical balloon in place for ripening.   Patient more comfortable, ctx decreased Start low dose Pit for ripening.   Neta Mendsaniela C Paul, MSN, CNM 02/21/2017, 10:32 AM

## 2017-02-21 NOTE — Progress Notes (Signed)
Patient ID: Hailey Davis, female   DOB: [birth year], 32 y.o.   MRN: [id number] No change in MVU with replaced IUPC Pt counseled about options. Will proceed with csection. Active phase arrest. Consent done.

## 2017-02-22 ENCOUNTER — Encounter (HOSPITAL_COMMUNITY): Payer: Self-pay | Admitting: Obstetrics and Gynecology

## 2017-02-22 LAB — CBC
HEMATOCRIT: 31.9 % — AB (ref 36.0–46.0)
Hemoglobin: 11.3 g/dL — ABNORMAL LOW (ref 12.0–15.0)
MCH: 31.3 pg (ref 26.0–34.0)
MCHC: 35.4 g/dL (ref 30.0–36.0)
MCV: 88.4 fL (ref 78.0–100.0)
Platelets: 123 10*3/uL — ABNORMAL LOW (ref 150–400)
RBC: 3.61 MIL/uL — AB (ref 3.87–5.11)
RDW: 13 % (ref 11.5–15.5)
WBC: 18.2 10*3/uL — AB (ref 4.0–10.5)

## 2017-02-22 MED ORDER — RHO D IMMUNE GLOBULIN 1500 UNIT/2ML IJ SOSY
300.0000 ug | PREFILLED_SYRINGE | Freq: Once | INTRAMUSCULAR | Status: AC
  Administered 2017-02-22: 300 ug via INTRAVENOUS
  Filled 2017-02-22: qty 2

## 2017-02-22 NOTE — Addendum Note (Signed)
Addendum  created 02/22/17 0803 by Yolonda Kidaarver, Alvenia Treese L, CRNA   Sign clinical note

## 2017-02-22 NOTE — Progress Notes (Signed)
Post Partum Day 1 Subjective: no complaints, tolerating PO and + flatus  Objective: Blood pressure 118/74, pulse (!) 50, temperature 98.4 F (36.9 C), resp. rate 20, height 5\' 5"  (1.651 m), weight 96 kg (211 lb 11.2 oz), last menstrual period 05/24/2016, SpO2 94 %, unknown if currently breastfeeding.  Physical Exam:  General: alert, cooperative and appears stated age Lochia: appropriate Uterine Fundus: firm Incision: healing well, no significant drainage, no dehiscence DVT Evaluation: No evidence of DVT seen on physical exam. Negative Homan's sign.  Recent Labs    02/21/17 1818 02/22/17 0503  HGB 13.1 11.3*  HCT 37.9 31.9*    Assessment/Plan: POD 1 Csection - twins Active phase arrest Routine post op care Circumcision prior to discharge   LOS: 1 day   Hailey Davis J 02/22/2017, 8:51 AM

## 2017-02-22 NOTE — Lactation Note (Signed)
This note was copied from a baby's chart. Lactation Consultation Note Babies 10 hrs old.not interested in feeding. Sleepy, has been spitty some. Baby has been grunting a little since delivery. No respiratory distress, labored breathing noted.  Mom stated baby "A" hasn't fed. Will not latch or do anything.  Hand expressed mom's tubular breast flowing colostrum. 3 ml. Mom had abcess 3 weeks ago and was surgically treated. Mom had cyst that grew and got infected. Mom has scar to lower center bottom of breast. Lt. Breast is a cup size larger than Rt. Mom states it has only been that way since the abscess. Mom stated she was cleared by surgeon to BF. Mom has some bloody discharge when hand expressing to Lt. Breast.  LC hand expressed Rt. Breast w/Rich yellow/orange colostrum. Lt. Breast expressed yellow/orange colostrum, then to two ducts thick dark bloody drainage. LC patted w/tissue to absorb the blood and not the colostrum.  W/gloved finger assessed suck to baby "A". Suck training to stimulate to suckle. Had not interest, just bit. Kept tongue behind gums, not cupping under finger. Much stimulation needed to finally start suckling. Baby would take colostrum from spoon, LC kept finger in mouth and drizzle tiny bit at a time above finger under top lip and baby would suck and swallow colostrum.  Baby had a very tight bite, suck. No cueing noted.  Mom stated baby "B" going to breast. Both has been spitty some and is sleepy.   Newborn behavior, feeding habits, STS, I&O, cluster feeding, supply and demand discussed. Mom encouraged to feed baby 8-12 times/24 hours and with feeding cues. Mom encouraged to waken baby for feeds if hasn't cued in 3 hrs. Discussed positioning during feeding. Mom had been putting both to the breast. Encouraged to BF one baby at a time to learn baby and train baby how to feed. Mom needs to focus on each baby's individual needs. Once baby's has learned to BF then can BF both at the same  time.  Gave additional feeding sheets. Encouraged to keep logs separate.  Discussed w/mom about pumping. Will ask RN to set up DEBP. Encouraged mom to pump for stimulation d/t babies to sleepy to feed. Encouraged STS, hand expression to stimulate to feed.  [facility] brochure given w/resources, support groups and LC services.  Patient Name: Hailey BerlinBoyA [name] ZOXWR'UToday's Date: 02/22/2017 Reason for consult: Early term 37-38.6wks;Multiple gestation   Maternal Data Has patient been taught Hand Expression?: Yes Does the patient have breastfeeding experience prior to this delivery?: No  Feeding Feeding Type: Breast Milk  LATCH Score Latch: Too sleepy or reluctant, no latch achieved, no sucking elicited.  Audible Swallowing: None  Type of Nipple: Everted at rest and after stimulation  Comfort (Breast/Nipple): Soft / non-tender        Interventions Interventions: Breast feeding basics reviewed;Breast massage;Hand express;Expressed milk;Position options;Support pillows  Lactation Tools Discussed/Used WIC Program: No   Consult Status Consult Status: Follow-up Date: 02/22/17 Follow-up type: In-patient    Kaliq Lege, Diamond NickelLAURA G 02/22/2017, 8:00 AM

## 2017-02-22 NOTE — Anesthesia Postprocedure Evaluation (Signed)
Anesthesia Post Note  Patient: Loney LohCourtney B Krupinski  Procedure(s) Performed: CESAREAN SECTION (N/A )     Patient location during evaluation: Mother Baby Anesthesia Type: Epidural Level of consciousness: awake, awake and alert, oriented and patient cooperative Pain management: pain level controlled Vital Signs Assessment: post-procedure vital signs reviewed and stable Respiratory status: spontaneous breathing, nonlabored ventilation and respiratory function stable Cardiovascular status: stable Postop Assessment: no headache, no backache, patient able to bend at knees and no apparent nausea or vomiting Anesthetic complications: no    Last Vitals:  Vitals:   02/22/17 0420 02/22/17 0730  BP:  118/74  Pulse:  (!) 50  Resp:  20  Temp: 36.5 C 36.9 C  SpO2: 91% 94%    Last Pain:  Vitals:   02/22/17 0730  TempSrc:   PainSc: 2    Pain Goal:                 Auria Mckinlay L

## 2017-02-23 ENCOUNTER — Other Ambulatory Visit: Payer: Self-pay

## 2017-02-23 LAB — RH IG WORKUP (INCLUDES ABO/RH)
ABO/RH(D): O NEG
FETAL SCREEN: NEGATIVE
Gestational Age(Wks): 37.6
UNIT DIVISION: 0

## 2017-02-23 NOTE — Lactation Note (Signed)
This note was copied from a baby's chart. Lactation Consultation Note Babies 5134 hrs old. Baby "B" is BF ok, baby "A" isn't BF well. Hand expressed 1 ml colostrum, spoon fed baby "A" had difficulty not wanting to suckle on gloved finger. Had no interest in latching. Mom had been holding both babies STS.  Bottle fed 15 ml formula to both babies. . DEBP set up.Mom shown how to use DEBP & how to disassemble, clean, & reassemble parts. Encouraged to call for assistance.  Patient Name: Hailey BerlinBoyA Hailey Davis ONGEX'BToday's Date: 02/23/2017 Reason for consult: Follow-up assessment;Multiple gestation;Early term 37-38.6wks   Maternal Data    Feeding Feeding Type: Bottle Fed - Formula Nipple Type: Slow - flow Length of feed: 0 min  LATCH Score Latch: Too sleepy or reluctant, no latch achieved, no sucking elicited.  Audible Swallowing: None  Type of Nipple: Everted at rest and after stimulation  Comfort (Breast/Nipple): Soft / non-tender  Hold (Positioning): Assistance needed to correctly position infant at breast and maintain latch.  LATCH Score: 5  Interventions Interventions: Breast feeding basics reviewed;Assisted with latch;Breast compression;Skin to skin;Adjust position;Breast massage;Support pillows;Hand express;Position options;Expressed milk;DEBP  Lactation Tools Discussed/Used Breast pump type: Double-Electric Breast Pump Pump Review: Setup, frequency, and cleaning;Milk Storage Initiated by:: Peri JeffersonL. Damoni Causby RN IBCLC Date initiated:: 02/23/17   Consult Status Consult Status: Follow-up Date: 02/24/17 Follow-up type: In-patient    Charyl DancerCARVER, Trishelle Devora G 02/23/2017, 5:37 AM

## 2017-02-23 NOTE — Lactation Note (Signed)
This note was copied from a baby's chart. Lactation Consultation Note Early term twins has little interest in BF at times. Baby "A" has been a poor BF. Took 31 hrs to void. Mom doing STS. Had some low temps today, doing lots of STS. W/gloved finger attempted baby to suckle, would not. Hand expressed 1 ml colostrum, rubbed on gums of baby "A" to stimulate to feed. No suckling. Would smack and took colostrum w/much stimulation. Discussed w/mom supplementing w/formula and bottle for suck training. Mom agreed. Supplement feeding amount information sheet will be given and reviewed.  Patient Name: Hailey BerlinBoyA Elina Davis LOVFI'EToday's Date: Hailey Davis     Maternal Data    Feeding Feeding Type: Breast Fed  LATCH Score Latch: Repeated attempts needed to sustain latch, nipple held in mouth throughout feeding, stimulation needed to elicit sucking reflex.  Audible Swallowing: A few with stimulation  Type of Nipple: Everted at rest and after stimulation  Comfort (Breast/Nipple): Soft / non-tender  Hold (Positioning): No assistance needed to correctly position infant at breast.  LATCH Score: 8  Interventions    Lactation Tools Discussed/Used     Consult Status      Hailey Davis Hailey Davis, 4:56 AM

## 2017-02-23 NOTE — Lactation Note (Signed)
This note was copied from a baby's chart. Lactation Consultation Note babies 1534 hrs old. Baby "B" BF better than "A". Baby sleepy, had been STS to mom. Supplemented 15 ml formula. Encouraged mom to pump and supplement if needed. When BF encouraged to breast massage. Assess breast for transfer when BF. encouraged to BF each baby separate. needs to supplement if not feeding well or output decreases. Supplement w/BM first if able then formula.  Patient Name: Hailey Davis Hailey Davis ZHYQM'VToday's Date: 02/23/2017 Reason for consult: Follow-up assessment;Multiple gestation   Maternal Data Has patient been taught Hand Expression?: Yes Does the patient have breastfeeding experience prior to this delivery?: No  Feeding Feeding Type: Bottle Fed - Formula Nipple Type: Slow - flow Length of feed: 0 min  LATCH Score                   Interventions Interventions: Breast feeding basics reviewed;Assisted with latch;Breast compression;Skin to skin;Adjust position;Breast massage;Support pillows;Hand express;Position options;DEBP;Hand pump;Expressed milk  Lactation Tools Discussed/Used Pump Review: Setup, frequency, and cleaning;Milk Storage Initiated by:: Peri JeffersonL. Lovella Hardie RN IBCLC Date initiated:: 02/23/17   Consult Status Consult Status: Follow-up Date: 02/24/17 Follow-up type: In-patient    Hailey DancerCARVER, Hailey Davis 02/23/2017, 5:46 AM

## 2017-02-23 NOTE — Progress Notes (Signed)
POSTOPERATIVE DAY # 2 S/P CS-twins / arrest of dilation  S:         Reports feeling ok - really tired with no sleep             Tolerating po intake / no nausea / no vomiting / + flatus / no BM             Bleeding is moderate             Pain controlled with motrin and oxycodone             Up ad lib / ambulatory/ voiding QS  Newborn Breast / Circumcision planned tomorrow (poor latch baby A)  O:  VS: BP 129/88 (BP Location: Right Arm)   Pulse 97   Temp (!) 97.3 F (36.3 C) (Axillary)   Resp 18   Ht 5\' 5"  (1.651 m)   Wt 96 kg (211 lb 11.2 oz)   LMP 05/24/2016   SpO2 97%   Breastfeeding? Unknown   BMI 35.23 kg/m    LABS:               Recent Labs    02/21/17 1818 02/22/17 0503  WBC 14.9* 18.2*  HGB 13.1 11.3*  PLT 122* 123*               Bloodtype: --/--/O NEG (02/22 0503)  - rhogam ordered (both newborns RH positive)  Rubella: Immune (09/12 0000)                Flu and tdap current                                         I&O: Intake/Output      02/22 0701 - 02/23 0700 02/23 0701 - 02/24 0700   I.V. (mL/kg)     Total Intake(mL/kg)     Urine (mL/kg/hr) 2450 (1.1)    Blood     Total Output 2450    Net -2450                    Physical Exam:             Alert and Oriented X3  Lungs: Clear and unlabored  Heart: regular rate and rhythm / no mumurs  Abdomen: soft, non-tender, mildly distended, active BS             Fundus: firm, non-tender, Ueven             Dressing intact               Incision:  approximated with suture / no erythema / no ecchymosis / no drainage  Perineum: intact  Lochia: moderate on pad  Extremities: 1+ dependent edema, no calf pain or tenderness, negative Homans  A:        POD # 2 S/P CS-twins / arrest of dilation            Mild thrombocytopenia (gestational etiology)  P:        Routine postoperative care              Activity ad lib             Lactation assist for latch on twin A - shallow latch / strategies reviewed  Anticipate dc tomorrow - newborn circs tomorrow    Marlinda Mikeanya Bailey CNM, MSN, [facility] 02/23/2017,  8:46 AM

## 2017-02-24 MED ORDER — MAGNESIUM OXIDE 250 MG PO TABS: 1.0000 | ORAL_TABLET | Freq: Every day | ORAL | 2 refills | Status: DC

## 2017-02-24 MED ORDER — OXYCODONE-ACETAMINOPHEN 5-325 MG PO TABS: 1.0000 | ORAL_TABLET | ORAL | 0 refills | Status: DC | PRN

## 2017-02-24 MED ORDER — IBUPROFEN 600 MG PO TABS: 600.0000 mg | ORAL_TABLET | Freq: Four times a day (QID) | ORAL | 0 refills | Status: DC

## 2017-02-24 NOTE — Discharge Summary (Signed)
OB Discharge Summary  Patient Name: Hailey Davis DOB: [birth year] MRN: [id number]  Date of admission: 02/21/2017  Admitting diagnosis: INDUCTION for MO-DI twin gestation Intrauterine pregnancy: [redacted]w[redacted]d      Date of discharge: 02/24/2017    Discharge diagnosis: Term Pregnancy Delivered and POD 3 s/p c-section for arrest of dilation     Prenatal history: G1P1002   EDC : 03/08/2017, by Ultrasound  Prenatal care at [facility]  Primary provider : Verdene Lennert Prenatal course complicated by twins (MO-DI)  Prenatal Labs: ABO, Rh: --/--/O NEG (02/22 0503) / Rhophylac given Antibody: POS (02/21 0154) Rubella: Immune (09/12 0000)  RPR: Non Reactive (02/21 0154)  HBsAg: Negative (09/12 0000)  HIV: Non-reactive (09/12 0000)  GBS: Negative (02/21 0000)                                    Hospital course:  Induction of Labor With Cesarean Section  32 y.o. yo G1P1002 at [redacted]w[redacted]d was admitted to the hospital 02/21/2017 for induction of labor. Patient had a labor course significant for protracted labor pattern without descent - arrest of labor at 4-5 cm. The patient went for cesarean section due to Arrest of Dilation, and delivered a Viable infant boys.    TRINITEY, ROACHE [[id number]]  02/21/2017   Ballantine, Cindi Carbon [[id number]]  02/21/2017  Membrane Rupture Time/Date:    Judi Saa [[id number]]  11:47 AM   Ugarte, Cindi Carbon [[id number]]  11:47 AM ,   NOELE, ICENHOUR [[id number]]  02/21/2017   Clingenpeel, Cindi Carbon [[id number]]  02/21/2017   Details of operation can be found in separate operative Note.  Patient had an uncomplicated postpartum course. She is ambulating, tolerating a regular diet, passing flatus, and urinating well.  Patient is discharged home in stable condition on 02/24/17 on POD 3.                                Augmentation: Pitocin Delivering PROVIDER:    DAKOTAH, HEIMAN [[id number]]   Olivia Mackie   Dinunzio, Cindi Carbon [[id number]]  Olivia Mackie                                                            Complications: None  Newborn Data:    JAKI, STEPTOE [[id number]]  Live born female  Birth Weight: 6 lb 14.9 oz (3145 g) APGAR: 7, 7  Newborn Delivery   Birth date/time:  02/21/2017 19:08:00 Delivery type:  C-Section, Low Transverse C-section categorization:  Primary      Stallman, ANAALICIA REIMANN [[id number]]  Live born female  Birth Weight: 6 lb 11.6 oz (3050 g) APGAR: 8, 8  Newborn Delivery   Birth date/time:  02/[birth year]/2017 19:09:00 Delivery type:  C-Section, Low Transverse C-section categorization:  Primary     Baby Feeding: Bottle and Breast Disposition:home with mother  Post partum procedures:rhogam  Labs: Lab Results  Component Value Date   WBC 18.2 (H) 02/22/2017   HGB 11.3 (L) 02/22/2017   HCT 31.9 (L) 02/22/2017   MCV 88.4 02/22/2017   PLT 123 (L) 02/22/2017   CMP Latest Ref  Rng & Units 01/25/2017  Creatinine 0.44 - 1.00 mg/dL 1.610.70      Physical Exam @ time of discharge:  Vitals:   02/22/17 2016 02/23/17 0500 02/23/17 1827 02/24/17 0559  BP: (!) 104/55 129/88 122/67 123/69  Pulse: 63 97 67 68  Resp: 18 18 20 18   Temp: 97.8 F (36.6 C) (!) 97.3 F (36.3 C) 98 F (36.7 C) 98.3 F (36.8 C)  TempSrc: Oral Axillary  Oral  SpO2: 95% 97%  97%  Weight:      Height:        General: alert, cooperative and no distress Lochia: appropriate Uterine Fundus: firm Perineum: intact Incision: Healing well with no significant drainage Extremities: DVT Evaluation: No evidence of DVT seen on physical exam.   Discharge instructions:  "Baby and Me Booklet" and [facility] Booklet  Discharge Medications:  Allergies as of 02/24/2017   No Known Allergies     Medication List    STOP taking these medications   aspirin EC 81 MG tablet   EVENING PRIMROSE OIL PO   oxyCODONE 5 MG immediate release tablet Commonly known as:   Oxy IR/ROXICODONE     TAKE these medications   acetaminophen 325 MG tablet Commonly known as:  TYLENOL Take 3 tablets (975 mg total) by mouth every 6 (six) hours as needed for mild pain, moderate pain or fever.   acidophilus Caps capsule Take 1 capsule by mouth 2 (two) times daily.   ibuprofen 600 MG tablet Commonly known as:  ADVIL,MOTRIN Take 1 tablet (600 mg total) by mouth every 6 (six) hours.   Magnesium Oxide 250 MG Tabs Take 1-2 tablets (250-500 mg total) by mouth daily.   oxyCODONE-acetaminophen 5-325 MG tablet Commonly known as:  PERCOCET/ROXICET Take 1 tablet by mouth every 4 (four) hours as needed (pain scale 4-7).   PNV PO Take 1 tablet by mouth 2 (two) times daily.            Discharge Care Instructions  (From admission, onward)        Start     Ordered   02/24/17 0000  Discharge wound care:    Comments:  Keep incision clean and dry   02/24/17 1143      Diet: routine diet  Activity: Advance as tolerated. Pelvic rest x 6 weeks.   Follow up:2 weeks    Signed: Marlinda Mikeanya Catlin Aycock CNM, MSN, [facility] 02/24/2017, 11:44 AM

## 2017-02-24 NOTE — Lactation Note (Signed)
This note was copied from a baby's chart. Lactation Consultation Note  Patient Name: Cindi CarbonBoyB Talene Rosado [id number]'XToday's Date: 02/24/2017  Mom and babies to be discharged today.  Baby A is not latching and Baby B is doing fairly well.  Babies are now 62 hours and both at a 7% weight loss.  Babies are being supplemented with Similac but are spitty.  MD recommended trying Alimentum.  Discussed importance of protecting milk supply and pumping every 2-3 hours.  Mom has been pumping and last obtained 20 mls from each breast.  Instructed on prevention and treatment of engorgement and keeping a good feeding/pumping diary at home.  Mom is agreeable to following up with an outpatient appointment.  Message left for clinic to call   Maternal Data    Feeding Feeding Type: Breast Fed Length of feed: 0 min(sleepy)  LATCH Score                   Interventions    Lactation Tools Discussed/Used     Consult Status      Huston FoleyMOULDEN, Ireanna Finlayson S 02/24/2017, 9:56 AM

## 2017-02-24 NOTE — Lactation Note (Signed)
This note was copied from a baby's chart. Lactation Consultation Note Baby "A" spitting after every feeding up to 1 hour after feeding. Parents concerned. Mom pumping and giving colostrum. Baby "B" cluster feeding. Mom supplementing at this time. Parents worried about baby spitting and can't rest. Asked mom to allow baby's to go to CN to be monitored while parents rest. Reported to RN. Discussed mom baby may have reflux to keep head elevate for at least 20 min. Mom stated she was doing that. Encouraged mom to report to MD.  Discussed w/mom babies will need to feeding, holding it down and good output before d/c home. Patient Name: Hailey BerlinBoyA Laikyn Davis Hailey Davis Reason for consult: Follow-up assessment   Maternal Data    Feeding Feeding Type: Formula Nipple Type: Slow - flow  LATCH Score                   Interventions    Lactation Tools Discussed/Used     Consult Status Consult Status: Follow-up Date: 02/24/17 Follow-up type: In-patient    Charyl DancerCARVER, Najmah Carradine G Davis, 12:49 AM

## 2017-02-24 NOTE — Progress Notes (Signed)
POSTOPERATIVE DAY # 3 S/P CS-twins  S:         Reports feeling well - ready to go home - slept x5 hours last night with babies in nursery             Tolerating po intake / no nausea / no vomiting / + flatus / no BM             Bleeding is light             Pain controlled with motrin             Up ad lib / ambulatory/ voiding QS  Newborn Bottle and Breast / Circumcision done today  O:  VS: BP 123/69 (BP Location: Right Arm)   Pulse 68   Temp 98.3 F (36.8 C) (Oral)   Resp 18   Ht 5\' 5"  (1.651 m)   Wt 96 kg (211 lb 11.2 oz)   LMP 05/24/2016   SpO2 97%   Breastfeeding? Unknown   BMI 35.23 kg/m    LABS:               Recent Labs    02/21/17 1818 02/22/17 0503  WBC 14.9* 18.2*  HGB 13.1 11.3*  PLT 122* 123*               Bloodtype: --/--/O NEG (02/22 0503) rhogam given  Rubella: Immune (09/12 0000)                                 Physical Exam:             Alert and Oriented X3  Lungs: Clear and unlabored  Heart: regular rate and rhythm / no mumurs  Abdomen: soft, non-tender, non-distended             Fundus: firm, non-tender, Ueven             Dressing intact              Incision:  approximated with suture / no erythema / no ecchymosis / no drainage  Perineum: intact  Lochia: light  Extremities: trace edema, no calf pain or tenderness  A:        POD # 3 S/P CS            Twins - lactation issues with latch twin A  P:        Routine postoperative care              Lactation support              recommend twin support groups - benefits of twins / assistance at home x 6 weeks / scheduled feedings with twins\             DC home - WOB booklet - instructions reveiwed   Marlinda Mikeanya Kenith Trickel CNM, MSN, [facility] 02/24/2017, 10:13 AM

## 2017-02-25 ENCOUNTER — Encounter (HOSPITAL_COMMUNITY): Payer: Self-pay | Admitting: Obstetrics and Gynecology

## 2017-02-25 LAB — TYPE AND SCREEN
ABO/RH(D): O NEG
Antibody Screen: POSITIVE
Unit division: 0
Unit division: 0

## 2017-02-25 LAB — BPAM RBC
Blood Product Expiration Date: 201903132359
Blood Product Expiration Date: 201903192359
Unit Type and Rh: 9500
Unit Type and Rh: 9500

## 2017-02-25 NOTE — Addendum Note (Signed)
Addendum  created 02/25/17 1125 by Bethena Midgetddono, Beatryce Colombo, MD   Intraprocedure Attestations filed, Intraprocedure Event edited

## 2017-03-08 ENCOUNTER — Ambulatory Visit: Payer: Self-pay

## 2017-03-08 NOTE — Lactation Note (Signed)
This note was copied from a baby's chart.   03/08/2017  Name: Hailey Davis Jacob Rohlfs MRN: [id number] Date of Birth: 02/21/2016 Gestational Age: Gestational Age: 2968w6d Birth Weight: 110.9 oz Weight today:    7 pounds 3 ounces (3260 grams) with no diaper  [name] has gained 332 grams in the last 7 days with an average daily weight gain of 47 grams a day.   Babies present today with mom and dad for follow up feeding assessment.   Mom reports she is mostly pumping and bottle feeding. She will latch infants a few times a day. Parents report that pumping and bottle feeding have helped get them more time to rest. The babies are eating every 3 hours and mom and dad are still awakening them to feed. Babies to have tongue and lip revisions with Dr. Chales Salmonwsley. Mom then plans to try to put them back to breast more then.   Mom initially did not want to latch infants to the breast. She then did latch [name] to the left breast for 15 minutes, mom reports no pain with feeding. Infant asleep post feeding. Scale only showed infant gained 6 ml but he had a large void just after weight so post feed weight is not accurate. Infant completed feed with 10 cc from the bottle and a large void and stool.   Mom then attempted to latch Barrett to the breast. Infant sleepy and difficult to awaken. Once STS he latched well and fed for about 15 minutes and transferred 36 ml. Feeding was completed with bottle and took 40 cc of formula. Mom did have nipple compression post feeding from Barrett.   Discussed with family that since infants are both over birth weight they can go 4 hours at night if they want with the goal of working towards demand feedings. Enc parents to make sure infants have 7-8 feeds a day with 480-640 ml (16-21 ounces) per day for each infant. [id number]  [facility]. To follow up with Dr. Chestine Sporelark on March 25th. Parents to follow up with Lactation  in 1 week or 1-2 days post  lip/tongue revision.   Praised parents for their efforts and they report all questions have been answered.     General Information: Mother's reason for visit: followup feeding assessment Consult: Follow-up Lactation consultant: Noralee StainSharon Jayli Fogleman RN,IBCLC Breastfeeding experience: mom is mostly pumping and bottle feeding   Maternal medications: Pre-natal vitamin, Motrin (ibuprofen), Other(Acidophyllis, Fenugreek 3 capsules TID)  Breastfeeding History: Frequency of breast feeding: 2 x a day Duration of feeding: 15 minutes  Supplementation: Supplement method: bottle Brand: Other(Parents Choice Gentle) Formula volume: 10-15 Formula frequency: 4 x a day Total formula volume per day: 40 ml-60 ml Breast milk volume: 60 ml Breast milk frequency: every 3 hours Total breast milk volume per day: 480 ml Pump type: Medela pump in style Pump frequency: every 3 hours Pump volume: 120 ml average  Infant Output Assessment: Voids per 24 hours: 8+ Urine color: Clear yellow Stools per 24 hours: 8+ Stool color: Yellow  Breast Assessment: Breast: Filling, Asymmetrical, Widely spaced, Compressible Nipple: Erect Pain level: 0 Pain interventions: Lanolin, Other, Bra(Mother Love Nipple Cream)  Feeding Assessment: Infant oral assessment: Variance Infant oral assessment comment: Infant wtih tight lingual frenulum wtih tight upper lip, lip blanches with flanging. infant noted to have posterior lingual frenulum. infant with good tongue lateralization and extension is better today. Infant wtih limited tongue elevation.  Positioning: Forensic psychologistCross cradle Latch: 2 -  Grasps breast easily, tongue down, lips flanged, rhythmical sucking. Audible swallowing: 2 - Spontaneous and intermittent Type of nipple: 2 - Everted at rest and after stimulation Comfort: 1 - Filling, red/small blisters or bruises, mild/mod discomfort Hold: 2 - No assistance needed to correctly position infant at breast LATCH score: 9 Latch  assessment: Deep Lips flanged: Yes Suck assessment: Displays both   Pre-feed weight: 3260 grams Post feed weight: 3266 + unweighted void Amount transferred: 6+ Amount supplemented: 10  Additional Feeding Assessment:                                    Totals: Total amount transferred: 6+ Total supplement given: 10    1. Continue to offer the breast 1-2 x a day for up to 30 minutes to keep infants interested in the breast.  2. Offer bottle after breast feeding as infants want 3. Continue pumping 8 x a day for 15-20 minutes to empty breasts 4. Consider power pumping once a day (pump for 20 minutes, rest for 20 minutes, pump for 10 minutes, rest for 10 minutes, pump for 10 minutes) and then take a 4-5 hour stretch at night of no pumping unless breast wakes you up and need to be emptied.  5. Both babies need 60-80 ml for 8 feedings a day or 480-640 ml (16-21 ounces) in 24 hours 6. Follow up with Lactation 1-2 days after tongue/lip revisions 7. Keep up the good work!! 8. Thank you for allowing me to assist you today    Donn Pierini RN, IBCLC                                                                              Debby Freiberg Amontae Ng 03/08/2017, 10:32 AM

## 2017-03-08 NOTE — Lactation Note (Signed)
This note was copied from a baby's chart. 03/08/2017  Name: Hailey SilversmithBarrett Lee Davis MRN: [id number] Date of Birth: 02/21/2016 Gestational Age: Gestational Age: 6115w6d Birth Weight: 107.6 oz Weight today:    7 pounds 3.8 ounces (3284 grams) with no diaper.   Hailey has gained 290 grams in the last 7 days with an average daily weight gain of 41 grams a day.   Babies present today with mom and dad for follow up feeding assessment.   Mom reports she is mostly pumping and bottle feeding. She will latch infants a few times a day. Parents report that pumping and bottle feeding have helped get them more time to rest. The babies are eating every 3 hours and mom and dad are still awakening them to feed. Babies to have tongue and lip revisions with Dr. Chales Salmonwsley. Mom then plans to try to put them back to breast more then.   Mom initially did not want to latch infants to the breast. She then did latch [name] to the left breast for 15 minutes, mom reports no pain with feeding. Infant asleep post feeding. Scale only showed infant gained 6 ml but he had a large void just after weight so post feed weight is not accurate. Infant completed feed with 10 cc from the bottle and a large void and stool.     Mom then attempted to latch Hailey to the breast. Infant sleepy and difficult to awaken. Once STS he latched well and fed for about 15 minutes and transferred 36 ml. Feeding was completed with bottle and took 40 cc. Mom did have nipple compression post feeding from Hailey.   Discussed with family that since infants are both over birth weight they can go 4 hours at night if they want with the goal of working towards demand feedings. Enc parents to make sure infants have 7-8 feeds a day with 480-640 ml (16-21 ounces) per day for each infant. [id number]  [facility]. To follow up with Dr. Chestine Sporelark on March 25th. Parents to follow up with Lactation 1-2 days post lip/tongue revision.    Praised parents for their efforts and they report all questions have been answered.    General Information: Mother's reason for visit: follow up feeding assessment Consult: Follow-up Lactation consultant: Noralee StainSharon Sarath Privott RN,IBCLC Breastfeeding experience: have been latching 2 x a day and mostly pumping and bottle feeding   Maternal medications: Pre-natal vitamin, Motrin (ibuprofen), Other(Acidophillis, Fenugreen 3 capsules TID)  Breastfeeding History: Frequency of breast feeding: 2 x a day Duration of feeding: 15 minutes  Supplementation: Supplement method: bottle(Dr. Brown's Level 1 nipple) Brand: Other(Parents Choice Gentle) Formula volume: 10-15 ml Formula frequency: 3-4 x a day Total formula volume per day: 30-60 ml Breast milk volume: 60 ml Breast milk frequency: every 3 hours Total breast milk volume per day: 480 ml Pump type: Medela pump in style Pump frequency: every 3 hours Pump volume: 120 ml average  Infant Output Assessment: Voids per 24 hours: 8+ Urine color: Clear yellow Stools per 24 hours: 10+ Stool color: Yellow  Breast Assessment: Breast: Soft, Widely spaced, Scars, Asymmetrical Nipple: Erect Pain level: 0 Pain interventions: Bra, Other, Lanolin(Mother Love Nipple Cream)  Feeding Assessment: Infant oral assessment: Variance Infant oral assessment comment: Infant with tight labial frenulum that inserts near the bottom of the gum ridge. upper lip flanges well. Infant wtih posterior lingual frenulum that he keep behind gumline with suckling. Infant with good lateralization and limited tongue  elevation and extension Positioning: Cross cradle Latch: 2 - Grasps breast easily, tongue down, lips flanged, rhythmical sucking. Audible swallowing: 2 - Spontaneous and intermittent Type of nipple: 2 - Everted at rest and after stimulation Comfort: 1 - Filling, red/small blisters or bruises, mild/mod discomfort Hold: 2 - No assistance needed to correctly position  infant at breast LATCH score: 9 Latch assessment: Deep Lips flanged: Yes Suck assessment: Displays both   Pre-feed weight: 3406 grams fully clothed Post feed weight: 3446 grams fully clothed Amount transferred: 36 ml  Supplement give: 40 cc formula    Additional Feeding Assessment:                                    Totals: Total amount transferred: 36 ml   Total amount pumped post feed: 0    1. Continue to offer the breast 1-2 x a day for up to 30 minutes to keep infants interested in the breast.  2. Offer bottle after breast feeding as infants want 3. Continue pumping 8 x a day for 15-20 minutes to empty breasts 4. Consider power pumping once a day (pump for 20 minutes, rest for 20 minutes, pump for 10 minutes, rest for 10 minutes, pump for 10 minutes) and then take a 4-5 hour stretch at night of no pumping unless breast wakes you up and need to be emptied.  5. Both babies need 60-80 ml for 8 feedings a day or 480-640 ml (16-21 ounces) in 24 hours 6. Follow up with Lactation 1-2 days after tongue/lip revisions 7. Keep up the good work!! 8. Thank you for allowing me to assist you today      Ed BlalockSharon S Kalif Kattner RN, IBCLC                                                                      Silas FloodSharon S Seriah Brotzman 03/08/2017, 9:35 AM

## 2017-03-12 ENCOUNTER — Ambulatory Visit: Payer: Self-pay

## 2017-03-12 NOTE — Lactation Note (Signed)
This note was copied from a baby's chart. 03/01/2017  Name: Hailey Davis MRN: [id number] Date of Birth: 02/21/2016 Gestational Age: Gestational Age: 6732w6d Birth Weight: 110.9 oz Weight today:    6 pounds 7.3 ounces (2928 grams) with clean diaper  Hailey Davis has gained 1.3 ounces as compared to Ped weight yesterday, he has a weight loss of 6 grams in the last 5 days per hospital d/c weight.   Both babies present today with mom and dad. Mom reports Hailey Davis needs to be awakened to feed and is latching much better. Infant is being supplemented post BF.   Mom is pumping 4- 8 x a day and getting 0-80 ml. Mom is drinking Mother's Milk tea, discussed calling OB about taking Fenugreek and increasing to dosage recommended for milk production.   Mom had surgery to left breast at 35 weeks for abscess. Mom reports she did not get engorged in that area. Mom noted to have slightly wide spaced breasts and she noted  little breast growth with pregnancy.    Hailey Davis is noted to have a thick labial frenulum with tight upper lip, lip blanches with lip flanging. Infant noted to have posterior lingual frenulum. Infant with good tongue lateralization. Infant noted to have tongue retracted with oral stimulation and when suckling on gloved finger. infant with poor mid tongue elevation and humping to back of tongue when infant suckling. Infant noted to have cheek dimpling and some clicking when feeding on bottle and the breast. Infant cups tongue and extends a little over the gumline but releases suction easily when finger pulled against suckle. Infant with tight oral tone and tends to bite/clamp down on finger and breast. Parents given information on Tongue/lip restrictions and Local Providers.   Hailey Davis latched to the right breast in the football hold. Infant latched easily. Infant needed lip flanging. Infant fed needing stimulation for about 20 minutes and transferred 22 ml. Mom finished feeding with a bottle.    Infants to follow up with Lactation on 1 week. To follow up with Ped at 1 month. [facility] nurse is planning to return on 3/5 or 3/12 for weight checks. Mom aware of BF Support Groups.    General Information: Mother's reason for visit: feeding assessment Consult: Initial Lactation consultant: Noralee StainSharon Devine Klingel RN,IBCLC Breastfeeding experience: started latching 2 days ago and is sometimes sleepy, he generally needs to be awakened to feed Maternal medications: Pre-natal vitamin, Motrin (ibuprofen), Percocet, Other(Acidophillis, Mg, Mother's Milk Tea QID)  Breastfeeding History: Frequency of breast feeding: every 3 hours Duration of feeding: 15-25 minutes  Supplementation: Supplement method: bottle(Dr. Brown's Level 1 nipple) Brand: (Parents Choice Gentle) Formula volume: 30 ml Formula frequency: 8 x a day Total formula volume per day: 8 ounces Pump type: Medela pump in style Pump frequency: 4-6 x a day Pump volume: 0-25  Infant Output Assessment: Voids per 24 hours: 6+ Urine color: Clear yellow Stools per 24 hours: 6+ Stool color: Brown  Breast Assessment: Breast: Soft, Asymmetrical, Widely spaced, Compressible(right breast slightly small that the left, surgical scar beneath left breast from abscess removal at 35 weeks) Nipple: Erect Pain level: 0 Pain interventions: Bra, Lanolin, Other(Mother Love NIpple Ointment)  Feeding Assessment: Infant oral assessment: Variance Infant oral assessment comment: Excell SeltzerBaker is noted to have a thick labial frenulum with tight upper lip, lip blanches with lip flanging. Infant noted to have posterior lingual frenulum. Infant with good tongue lateralization. Infant noted to have tongue retracted with oral stimulation and when suckling on gloved finger.  infant with poor mid tongue elevation and humping to back of tongue when infant suckling. Infant noted to have cheek dimpling and some clicking when feeding on bottle and the breast. Infant  cups tongue and extends a little over the gumline but releases suction easily when finger pulled against suckle. Infant with tight oral tone and tends to bite/clamp down on finger and breast. Parents given information on Tongue/lip restrictions and Local Providers.  Positioning: Football Latch: 1 - Repeated attempts needed to sustain latch, nipple held in mouth throughout feeding, stimulation needed to elicit sucking reflex. Audible swallowing: 1 - A few with stimulation Type of nipple: 2 - Everted at rest and after stimulation Comfort: 2 - Soft/non-tender Hold: 1 - Assistance needed to correctly position infant at breast and maintain latch LATCH score: 7 Latch assessment: Shallow Lips flanged: No Suck assessment: Displays both Pre-feed weight: 2928 grams Post feed weight: 2950 grams Amount transferred: 22 grams Amount supplemented: 35 ml  Additional Feeding Assessment:  Totals: Total amount transferred: 22 ml Total supplement given: 35 ml   1. Continue to offer the breast with each feeding for up to 30 minutes 2. Suck training prior to each feeding for 1-2 minutes.  3. Keep infant awake at the breast 4. Massage breast with feedings 5. Offer supplement of breast milk or formula after breast feeding 6. Continue to pump 4-6 x a day post BF to protect supply, use hands on pumping when pumping 7. Consider taking Fenugreek or Mother Love More Milk Plus after talking with OB, dosage 3-610 mg capsules 3 x a day  8. Kolbe and Hailey Davis needs about 54-73 ml for 8 feedings a day 9. Tummy Time 3 x a day for 5-10 minutes when infant awake and when parents watching when ok with Ped.  10. Call for assistance as needed [phone] 11. Keep up the good work 47. Thank you for allowing me to assist you today     Nonah Mattes RN,  [facility]                                                              Electronically signed by Donn Pierini, RN at 03/04/2017 8:29 AM Electronically signed by Donn Pierini, RN at 03/04/2017 8:34 AM     Clinical Support on 03/01/2017        Revision History        Detailed Report

## 2017-03-12 NOTE — Lactation Note (Signed)
This note was copied from a baby's chart. 03/01/2017  Name: Hailey Davis MRN: [id number] Date of Birth: 02/21/2016 Gestational Age: Gestational Age: 7051w6d Birth Weight: 107.6 oz Weight today:    6 pounds 9.6 ounces (2994 grams) with clean newborn diaper  Hailey has gained 145 grams in the last 5 days with an average daily weight gain of 29 grams/day.  Parents here with both babies for feeding assessments.   Mom had surgery to left breast at 35 weeks for abscess. Mom reports she did not get engorged in that area. Mom noted to have slightly wide spaced breasts and she noted  little breast growth with pregnancy.    Mom and dad report infant is latching well. They report he tends to want to cluster feed at night and is most satisfied at the breast. He has recently started asking for supplement post BF which parents are giving with a Dr. Theora GianottiBrown's bottle Level 1 nipple. Need for supplementation needed when Twin started nursing at the breast.  Hailey is noted to have a tight labial frenulum that inserts near the bottom of the gum ridge. Upper lip flanges well. Infant with posterior lingual frenulum noted with crying and on manual exam. Infant does not extend well with rest, oral stimulation and when suckling on gloved finger. Infant with good tongue lateralization. He is noted to have decreased mid tongue elevation. Infant cups tongue and has a good suckle on gloved finger. Parents were given information on Tongue/Lip restrictions with local providers.    Mom is pumping 4- 8 x a day and getting 0-80 ml. Mom is drinking Mother's Milk tea, discussed calling OB about taking Fenugreek and increasing to dosage recommended for milk production.   Hailey latched to the left breast easily. He latched shallowly with lips curled in. Enc mom to unflange lips with feeding. Infant fed off and on for about 30 minutes and transferred 39 ml. Nipple was slightly compressed at the very end of the feeding.  Dad finished feeding with bottle and infant took 10 cc.   Parents to follow up with Ped at 1 month. [facility] nurse will return on 3/5 and 3/12 for weight check. Infants with follow up with Lactation in 1 week. Mom aware of BF Support Groups.    General Information: Mother's reason for visit: feeding assessment Consult: Initial Lactation consultant: Noralee StainSharon Traniya Prichett RN,IBCLC Breastfeeding experience: has been latching well, has recently started asking for supplement after BF, wants to nurse all night Maternal medications: Pre-natal vitamin, Other, Percocet, Motrin (ibuprofen)(Acidophillis, Mg)  Breastfeeding History: Frequency of breast feeding: every 3 hours Duration of feeding: 20-40 minutes  Supplementation: Supplement method: bottle(Dr. Brown's Level 1 nipple) Brand: Other(Parents Choice Gentle) Formula volume: 30 ml Formula frequency: every 3 hours Total formula volume per day: 8 ounces Breast milk volume: BF every 3 hours Pump type: Medela pump in style Pump frequency: every 3 hours Pump volume: 0-80 ml  Infant Output Assessment: Voids per 24 hours: 6+ Urine color: Clear yellow Stools per 24 hours: 6+ Stool color: Brown  Breast Assessment: Breast: Soft, Compressible, Widely spaced, Scars, Asymmetrical(right breast slightly smaller than left breast, slightly wide spaced, scar to left breast from abscess removal) Nipple: Erect Pain level: 0 Pain interventions: Bra, Other, Lanolin(Mother Love Nipple Cream)  Feeding Assessment: Infant oral assessment: Variance Infant oral assessment comment: Hailey is noted to have a tight labial frenulum that inserts near the bottom of the gum ridge. Upper lip flanges well. Infant with posterior lingual frenulum  noted with crying and on manual exam. Infant does not extend well with rest, oral stimulation and when suckling on gloved finger. Infant with good tongue lateralization. He is noted to have decreased mid tongue elevation.  Infant cups tongue and has a good suckle on gloved finger. Parents were given information on Tongue/Lip restrictions with local providers.  Positioning: Football Latch: 1 - Repeated attempts needed to sustain latch, nipple held in mouth throughout feeding, stimulation needed to elicit sucking reflex. Audible swallowing: 2 - Spontaneous and intermittent Type of nipple: 2 - Everted at rest and after stimulation Comfort: 2 - Soft/non-tender Hold: 1 - Assistance needed to correctly position infant at breast and maintain latch LATCH score: 8 Latch assessment: Shallow Lips flanged: No Suck assessment: Displays both Pre-feed weight: 2994 grams Post feed weight: 3032 grams Amount transferred: 38 ml Amount supplemented: 10 ml  Additional Feeding Assessment:  Totals: Total amount transferred: 38 ml Total supplement given: 10 ml  1. Continue to offer the breast with each feeding for up to 30 minutes 2. Suck training prior to each feeding for 1-2 minutes.  3. Keep infant awake at the breast 4. Massage breast with feedings 5. Offer supplement of breast milk or formula after breast feeding 6. Continue to pump 4-6 x a day post BF to protect supply, use hands on pumping when pumping 7. Consider taking Fenugreek or Mother Love More Milk Plus after talking with OB, dosage 3-610 mg capsules 3 x a day  8. Hailey Davis needs about 54-73 ml for 8 feedings a day 9. Tummy Time 3 x a day for 5-10 minutes when infant awake and when parents watching when ok with Ped.  10. Call for assistance as needed [phone] 11. Keep up the good work 12. Thank you for allowing me to assist you today   Ed Blalock RN, [facility] 03/01/2017

## 2017-08-19 ENCOUNTER — Other Ambulatory Visit: Payer: Self-pay | Admitting: Certified Nurse Midwife

## 2018-10-01 IMAGING — US US MFM OB DETAIL+14 WK
1 series · 12 of 28 positions shown · non-contrast
Comparison: none

& Infertility
0646 [REDACTED]

WK
1  NAZARETH JUMPER              [id number]       [id number]     [id number]
2  NAZARETH JUMPER              [id number]       [id number]     [id number]
3  NAZARETH JUMPER              [id number]       [id number]     [id number]
Indications
16 weeks gestation of pregnancy
Twin pregnancy, Wasser/Jim, second trimester
Encounter for antenatal screening for
malformations
OB History
Gravidity:    1
Fetal Evaluation (Fetus A)
Num Of Fetuses:     2
Fetal Heart         150
Rate(bpm):
Cardiac Activity:   Observed
Fetal Lie:          Maternal right side
Presentation:       Cephalic
Placenta:           Posterior
P. Cord Insertion:  Visualized
Membrane Desc:      Dividing Membrane seen
Amniotic Fluid
AFI FV:      Subjectively within normal limits
Largest Pocket(cm)
4.6
Biometry (Fetus A)
BPD:      37.2  mm     G. Age:  17w 3d         73  %    CI:        75.75   %   70 - 86
FL/HC:      15.8   %   14.6 -
HC:      135.5  mm     G. Age:  17w 0d         48  %    HC/AC:      1.20       1.07 -
AC:      112.9  mm     G. Age:  17w 1d         58  %    FL/BPD:     57.5   %
FL:       21.4  mm     G. Age:  16w 3d         28  %    FL/AC:      19.0   %   20 - 24
HUM:      19.9  mm     G. Age:  15w 6d         30  %
Est. FW:     170  gm      0 lb 6 oz     56  %     FW Discordancy         2  %
Gestational Age (Fetus A)
U/S Today:     17w 0d                                        EDD:   03/07/17
Best:          16w 6d    Det. By:   Early Ultrasound         EDD:   03/08/17
(08/07/16)
Anatomy (Fetus A)
Cranium:               Appears normal         Aortic Arch:            Not well visualized
Cavum:                 Appears normal         Ductal Arch:            Not well visualized
Ventricles:            Not well visualized    Diaphragm:              Appears normal
Choroid Plexus:        heterogeneous          Stomach:                Appears normal, left
choroid plexus
sided
Cerebellum:            Appears normal         Abdomen:                Appears normal
Posterior Fossa:       Appears normal         Abdominal Wall:         Appears nml (cord
insert, abd wall)
Nuchal Fold:           Appears normal         Cord Vessels:           Appears normal (3
vessel cord)
Face:                  Not well visualized    Kidneys:                Appear normal
Lips:                  Not well visualized    Bladder:                Appears normal
Thoracic:              Appears normal         Spine:                  Appears normal
Heart:                 Not well visualized    Upper Extremities:      Appears normal
RVOT:                  Not well visualized    Lower Extremities:      Appears normal
LVOT:                  Not well visualized
Other:  Parents do not wish to know sex of fetus. Fetus appears to be a male.
5th digit visualized. Technically difficult due to early gestational age.
Fetal Evaluation (Fetus B)
Fetal Heart         141
Fetal Lie:          Maternal left side
Presentation:       Breech
4.2
Biometry (Fetus B)
BPD:      36.3  mm     G. Age:  17w 1d         62  %    CI:        73.43   %   70 - 86
FL/HC:      16.4   %   14.6 -
HC:      134.6  mm     G. Age:  17w 0d         45  %    HC/AC:      1.18       1.07 -
AC:       114   mm     G. Age:  17w 1d         61  %    FL/BPD:     60.9   %
FL:       22.1  mm     G. Age:  16w 4d         36  %    FL/AC:      19.4   %   20 - 24
HUM:      20.8  mm     G. Age:  16w 2d         40  %
Est. FW:     174  gm      0 lb 6 oz     58  %     FW Discordancy      0 \ 2 %
Gestational Age (Fetus B)
Anatomy (Fetus B)
Cavum:                 Not well visualized    Ductal Arch:            Not well visualized
Ventricles:            Appears normal         Diaphragm:              Not well visualized
Choroid Plexus:        Appears normal         Stomach:                Appears normal, left
Face:                  Appears normal         Kidneys:                Appear normal
(orbits and profile)
Heart:                 Appears normal         Upper Extremities:      Appears normal
(4CH, axis, and situs
RVOT:                  Not well visualized    Lower Extremities:      Appears normal;
ankles nws
LVOT:                  Appears normal
5th digit previously seen. Open hands visualized. Nasal bone
visualized. Technically difficult due to early gestational age.
Cervix Uterus Adnexa
Cervix
Length:            4.3  cm.
Measured transvaginally.
Uterus
No abnormality visualized.
Left Ovary
No adnexal mass visualized.
Right Ovary
Cul De Sac:   No free fluid seen.
Adnexa:       No abnormality visualized.
Impression
INDICATION: 31 yr old G1P0 at 22w2d with
monochorionic/diamniotic twin gestation for fetal anatomic
survey and cervical length.

[Series 1: us mfm ob detail+14 wk · 12 of 146 slices shown]
[im 6/146]
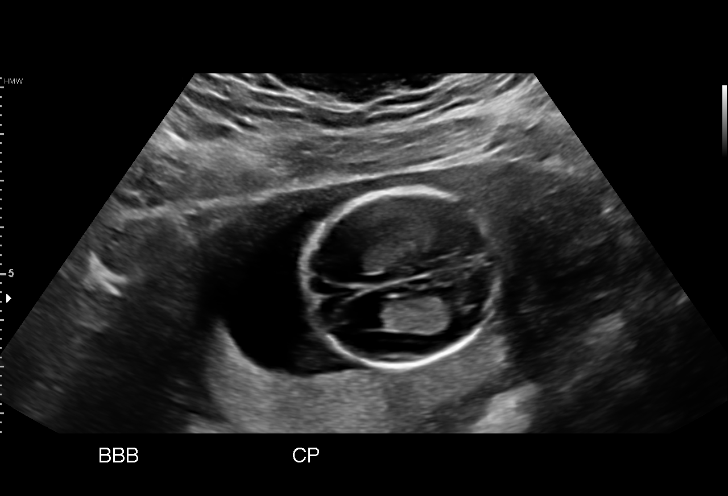
[im 17/146]
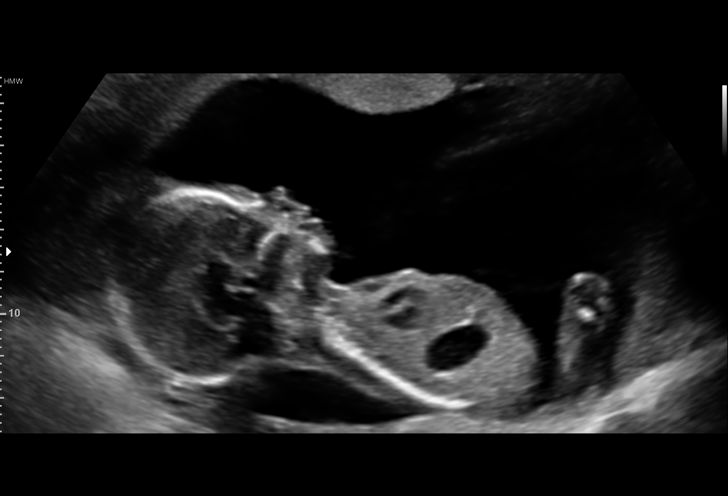
[im 27/146]
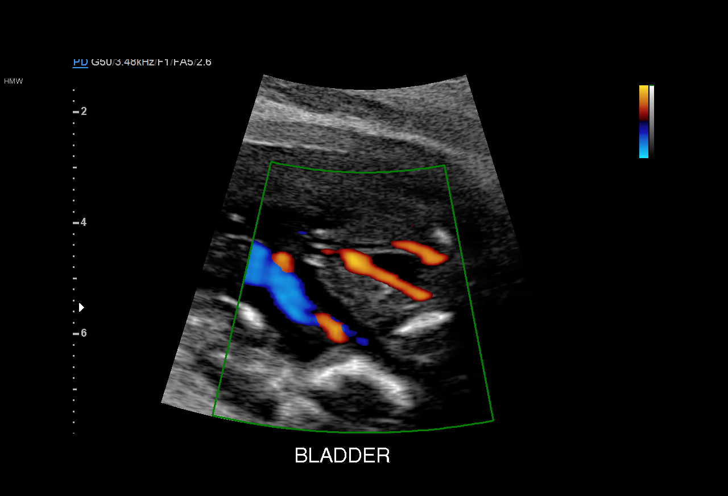
[im 43/146]
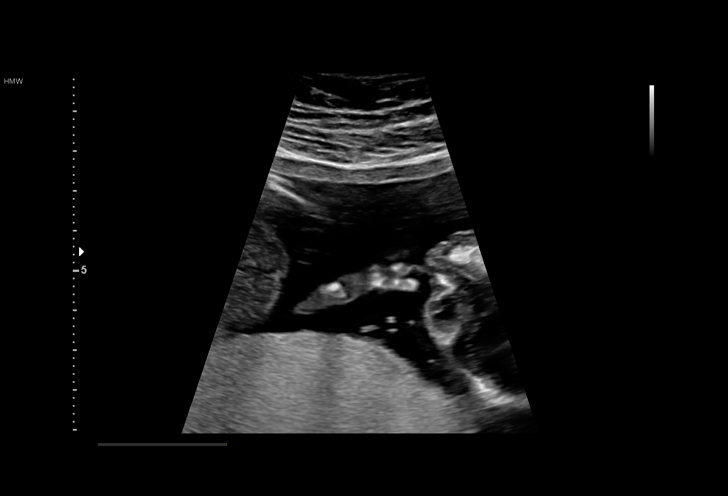
[im 54/146]
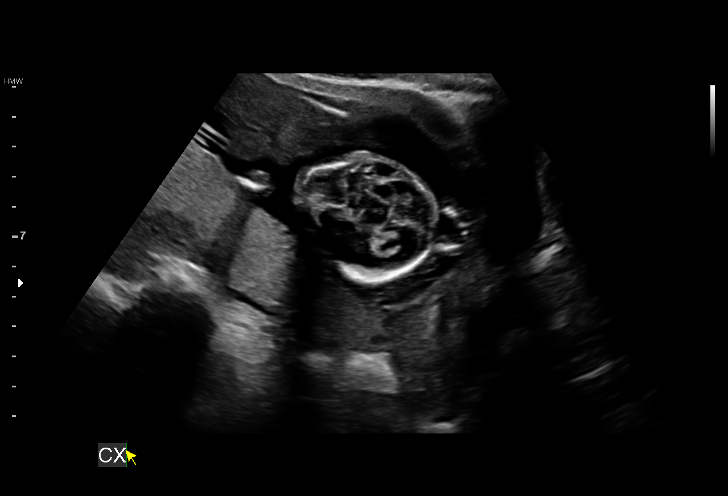
[im 65/146]
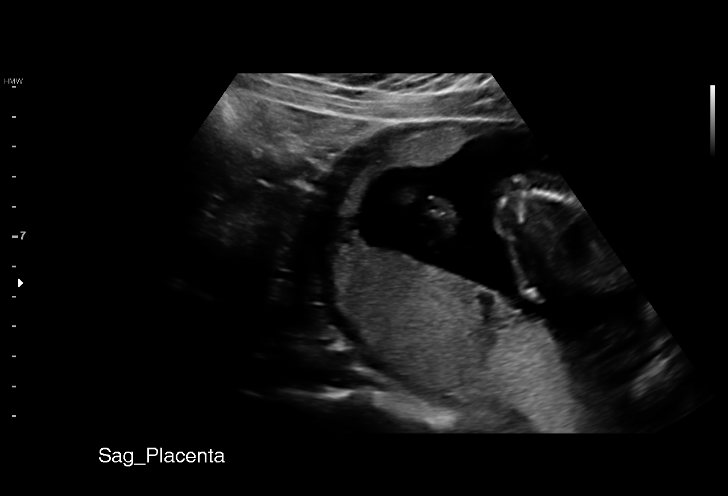
[im 81/146]
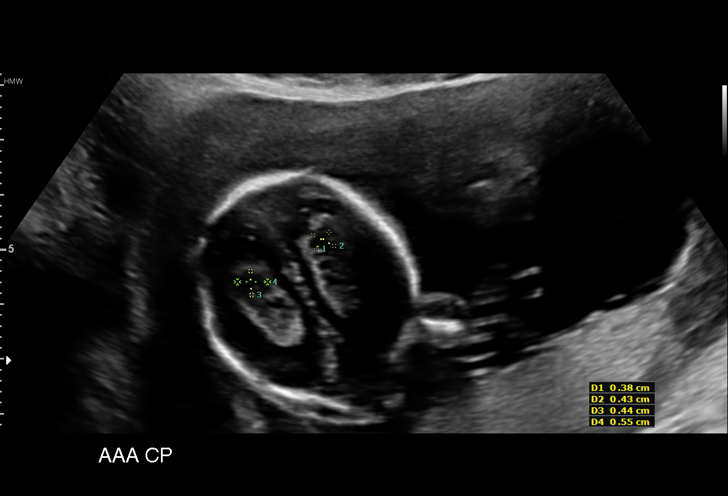
[im 92/146]
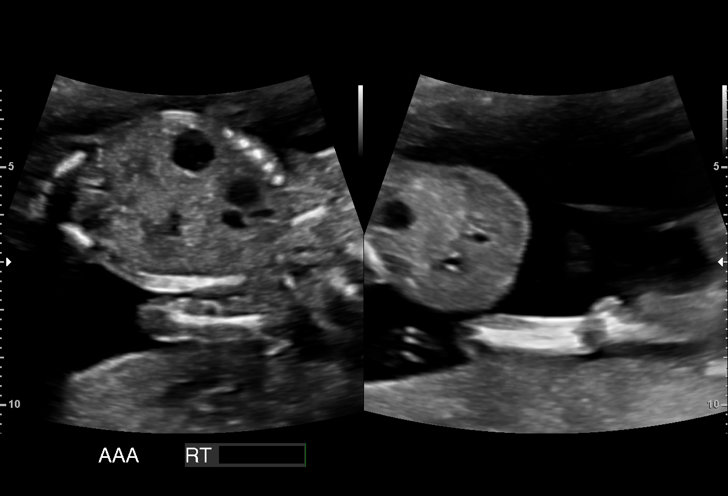
[im 103/146]
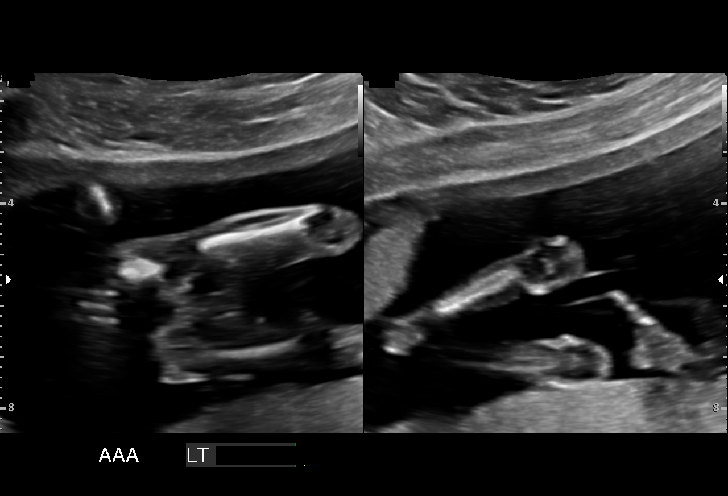
[im 119/146]
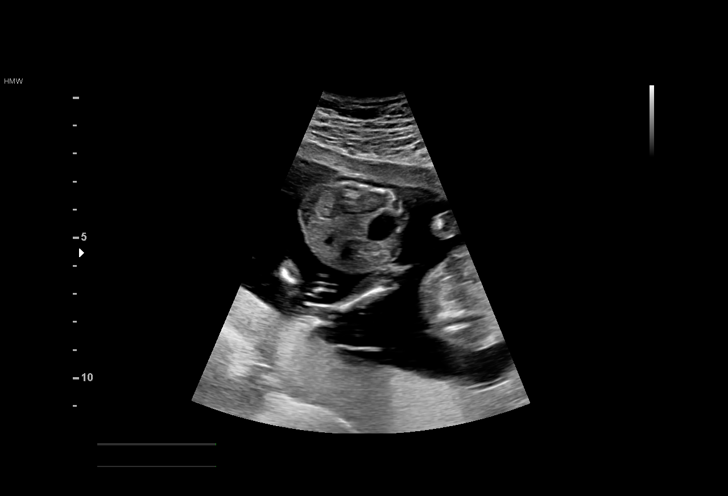
[im 129/146]
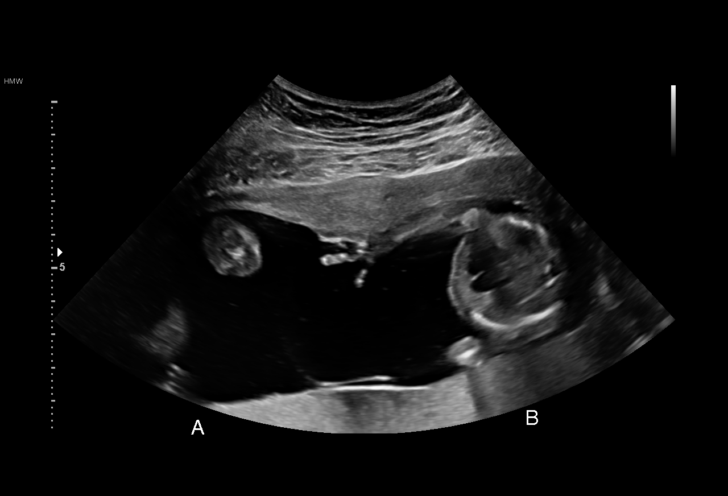
[im 140/146]
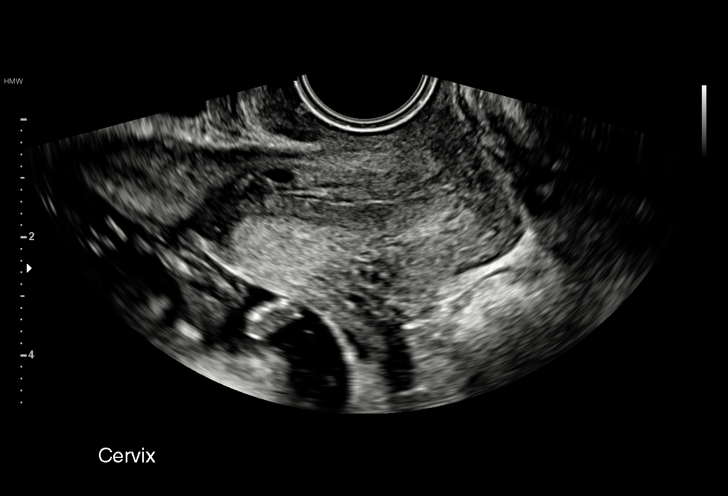

[12 of 28 positions shown; findings below may reference images not displayed]

FINDINGS: 1. Monochorionic/diamniotic twin gestation; the dividing
membrane is seen.
2. Fetal biometry is consistent with dating for both fetuses;
the fetuses are concordant.
3. Posterior placenta without evidence of previa.
4. Normal amniotic fluid volume for both fetuses.
5. Normal transvaginal cervical length.
6. The anatomy surveys are limited as above for both
fetuses; no abnormalities seen on limited exam. Twin A has
heterogeneous choroid plexus.
Recommendations

1. Appropriate fetal growth.
2. Limited anatomy surveys:
- recommend follow up in 2 weeks to reevaluate fetal anatomy
3. Monochorionic/diamiotic twin gestation:
- previously counseled
- recommend ultrasounds at least every 2 weeks to evaluate
for twin twin transfusion syndrome- no evidence of TTTS
seen on today's exam
- recommend fetal growth at least every 4 weeks
- recommend start antenatal testing by 32 weeks
- recommend delivery by 37 weeks
- recommend preterm labor precautions
- recommend fetal echocardiograms- ordered
4. Twin A with heterogeneous choroid:
- not associated with increased risk of aneuploidy- not
discussed with patient

## 2019-01-22 ENCOUNTER — Telehealth: Payer: Self-pay | Admitting: *Deleted

## 2019-01-22 NOTE — Telephone Encounter (Signed)
Pt states she was referred to our office for a painful toe and given an antibiotic and it improved but is still painful when pressed.

## 2019-01-22 NOTE — Telephone Encounter (Signed)
Left message informing pt that in some cases antibiotic will help a painful toe and nail, but if the problem that was causing the pain is not corrected, the toenail growing down the sides, then it is likely to occur again, to call [phone] to schedule an appt.

## 2019-04-02 LAB — OB RESULTS CONSOLE HIV ANTIBODY (ROUTINE TESTING): HIV: NONREACTIVE

## 2019-04-02 LAB — OB RESULTS CONSOLE GC/CHLAMYDIA
Chlamydia: NEGATIVE
Gonorrhea: NEGATIVE

## 2019-04-02 LAB — OB RESULTS CONSOLE ANTIBODY SCREEN: Antibody Screen: NEGATIVE

## 2019-04-02 LAB — OB RESULTS CONSOLE HEPATITIS B SURFACE ANTIGEN: Hepatitis B Surface Ag: NEGATIVE

## 2019-04-02 LAB — OB RESULTS CONSOLE RPR: RPR: NONREACTIVE

## 2019-04-02 LAB — OB RESULTS CONSOLE RUBELLA ANTIBODY, IGM: Rubella: IMMUNE

## 2019-04-02 LAB — OB RESULTS CONSOLE ABO/RH: RH Type: NEGATIVE

## 2019-04-03 ENCOUNTER — Other Ambulatory Visit: Payer: Self-pay | Admitting: Obstetrics and Gynecology

## 2019-04-03 DIAGNOSIS — N63 Unspecified lump in unspecified breast: Secondary | ICD-10-CM

## 2019-04-13 ENCOUNTER — Ambulatory Visit
Admission: RE | Admit: 2019-04-13 | Discharge: 2019-04-13 | Disposition: A | Discharge status: Home / Self Care | Payer: 59 | Source: Ambulatory Visit | Attending: Obstetrics and Gynecology | Admitting: Obstetrics and Gynecology

## 2019-04-13 ENCOUNTER — Other Ambulatory Visit: Payer: Self-pay

## 2019-04-13 DIAGNOSIS — N63 Unspecified lump in unspecified breast: Secondary | ICD-10-CM

## 2019-10-15 ENCOUNTER — Other Ambulatory Visit: Payer: Self-pay | Admitting: Obstetrics and Gynecology

## 2019-10-22 ENCOUNTER — Encounter (HOSPITAL_COMMUNITY): Payer: Self-pay | Admitting: *Deleted

## 2019-10-22 ENCOUNTER — Telehealth (HOSPITAL_COMMUNITY): Payer: Self-pay | Admitting: *Deleted

## 2019-10-22 NOTE — Patient Instructions (Signed)
Hailey Davis  10/22/2019   Your procedure is scheduled on:  11/05/2019  Arrive at 1030 at Entrance C on [address] at [facility]  and [address]. You are invited to use the FREE valet parking or use the Visitor's parking deck.  Pick up the phone at the desk and dial [phone].  Call this number if you have problems the morning of surgery: [phone]  Remember:   Do not eat food:(After Midnight) Desps de medianoche.  Do not drink clear liquids: (After Midnight) Desps de medianoche.  Take these medicines the morning of surgery with A SIP OF WATER:  none   Do not wear jewelry, make-up or nail polish.  Do not wear lotions, powders, or perfumes. Do not wear deodorant.  Do not shave 48 hours prior to surgery.  Do not bring valuables to the hospital.  [facility] is not   responsible for any belongings or valuables brought to the hospital.  Contacts, dentures or bridgework may not be worn into surgery.  Leave suitcase in the car. After surgery it may be brought to your room.  For patients admitted to the hospital, checkout time is 11:00 AM the day of              discharge.      Please read over the following fact sheets that you were given:     Preparing for Surgery

## 2019-10-22 NOTE — Telephone Encounter (Signed)
Preadmission screen  

## 2019-10-26 ENCOUNTER — Telehealth (HOSPITAL_COMMUNITY): Payer: Self-pay | Admitting: *Deleted

## 2019-10-26 NOTE — Telephone Encounter (Signed)
Preadmission screen  

## 2019-10-27 ENCOUNTER — Telehealth (HOSPITAL_COMMUNITY): Payer: Self-pay | Admitting: *Deleted

## 2019-10-27 NOTE — Telephone Encounter (Signed)
Preadmission screen  

## 2019-10-28 ENCOUNTER — Encounter (HOSPITAL_COMMUNITY): Payer: Self-pay

## 2019-11-03 ENCOUNTER — Encounter (HOSPITAL_COMMUNITY)
Admission: RE | Admit: 2019-11-03 | Discharge: 2019-11-03 | Disposition: A | Discharge status: Home / Self Care | Payer: 59 | Source: Ambulatory Visit | Attending: Obstetrics and Gynecology | Admitting: Obstetrics and Gynecology

## 2019-11-03 ENCOUNTER — Other Ambulatory Visit: Payer: Self-pay

## 2019-11-03 ENCOUNTER — Other Ambulatory Visit (HOSPITAL_COMMUNITY)
Admission: RE | Admit: 2019-11-03 | Discharge: 2019-11-03 | Disposition: A | Discharge status: Home / Self Care | Payer: 59 | Source: Ambulatory Visit | Attending: Obstetrics and Gynecology | Admitting: Obstetrics and Gynecology

## 2019-11-03 DIAGNOSIS — Z01812 Encounter for preprocedural laboratory examination: Secondary | ICD-10-CM | POA: Insufficient documentation

## 2019-11-03 DIAGNOSIS — Z20822 Contact with and (suspected) exposure to covid-19: Secondary | ICD-10-CM | POA: Insufficient documentation

## 2019-11-03 LAB — TYPE AND SCREEN
ABO/RH(D): O NEG
Antibody Screen: POSITIVE

## 2019-11-03 LAB — CBC
HCT: 38.8 % (ref 36.0–46.0)
Hemoglobin: 13.4 g/dL (ref 12.0–15.0)
MCH: 31 pg (ref 26.0–34.0)
MCHC: 34.5 g/dL (ref 30.0–36.0)
MCV: 89.8 fL (ref 80.0–100.0)
Platelets: 151 10*3/uL (ref 150–400)
RBC: 4.32 MIL/uL (ref 3.87–5.11)
RDW: 12.7 % (ref 11.5–15.5)
WBC: 10.1 10*3/uL (ref 4.0–10.5)
nRBC: 0 % (ref 0.0–0.2)

## 2019-11-03 LAB — SARS CORONAVIRUS 2 (TAT 6-24 HRS): SARS Coronavirus 2: NEGATIVE

## 2019-11-03 LAB — RPR: RPR Ser Ql: NONREACTIVE

## 2019-11-05 ENCOUNTER — Inpatient Hospital Stay (HOSPITAL_COMMUNITY): Payer: 59 | Admitting: Anesthesiology

## 2019-11-05 ENCOUNTER — Other Ambulatory Visit: Payer: Self-pay

## 2019-11-05 ENCOUNTER — Encounter (HOSPITAL_COMMUNITY): Payer: Self-pay | Admitting: Obstetrics and Gynecology

## 2019-11-05 ENCOUNTER — Inpatient Hospital Stay (HOSPITAL_COMMUNITY)
Admission: RE | Admit: 2019-11-05 | Discharge: 2019-11-07 | DRG: 788 | Disposition: A | Discharge status: Home / Self Care | Payer: 59 | Attending: Obstetrics and Gynecology | Admitting: Obstetrics and Gynecology

## 2019-11-05 ENCOUNTER — Encounter (HOSPITAL_COMMUNITY)
Admission: RE | Disposition: A | Discharge status: Home / Self Care | Payer: Self-pay | Source: Home / Self Care | Attending: Obstetrics and Gynecology

## 2019-11-05 DIAGNOSIS — O26893 Other specified pregnancy related conditions, third trimester: Secondary | ICD-10-CM | POA: Diagnosis present

## 2019-11-05 DIAGNOSIS — O3663X0 Maternal care for excessive fetal growth, third trimester, not applicable or unspecified: Secondary | ICD-10-CM | POA: Diagnosis present

## 2019-11-05 DIAGNOSIS — O99824 Streptococcus B carrier state complicating childbirth: Secondary | ICD-10-CM | POA: Diagnosis present

## 2019-11-05 DIAGNOSIS — Z98891 History of uterine scar from previous surgery: Secondary | ICD-10-CM

## 2019-11-05 DIAGNOSIS — O34211 Maternal care for low transverse scar from previous cesarean delivery: Secondary | ICD-10-CM | POA: Diagnosis present

## 2019-11-05 DIAGNOSIS — O34219 Maternal care for unspecified type scar from previous cesarean delivery: Secondary | ICD-10-CM | POA: Diagnosis present

## 2019-11-05 DIAGNOSIS — O403XX0 Polyhydramnios, third trimester, not applicable or unspecified: Secondary | ICD-10-CM | POA: Diagnosis present

## 2019-11-05 DIAGNOSIS — Z6791 Unspecified blood type, Rh negative: Secondary | ICD-10-CM

## 2019-11-05 DIAGNOSIS — Z20822 Contact with and (suspected) exposure to covid-19: Secondary | ICD-10-CM | POA: Diagnosis present

## 2019-11-05 DIAGNOSIS — Z3A40 40 weeks gestation of pregnancy: Secondary | ICD-10-CM

## 2019-11-05 DIAGNOSIS — O26899 Other specified pregnancy related conditions, unspecified trimester: Secondary | ICD-10-CM

## 2019-11-05 SURGERY — Surgical Case
Anesthesia: Spinal

## 2019-11-05 MED ORDER — KETOROLAC TROMETHAMINE 30 MG/ML IJ SOLN
30.0000 mg | Freq: Four times a day (QID) | INTRAMUSCULAR | Status: AC
  Administered 2019-11-05 – 2019-11-06 (×3): 30 mg via INTRAVENOUS
  Filled 2019-11-05 (×3): qty 1

## 2019-11-05 MED ORDER — DIBUCAINE (PERIANAL) 1 % EX OINT: 1.0000 "application " | TOPICAL_OINTMENT | CUTANEOUS | Status: DC | PRN

## 2019-11-05 MED ORDER — ONDANSETRON HCL 4 MG/2ML IJ SOLN
INTRAMUSCULAR | Status: DC | PRN
  Administered 2019-11-05: 4 mg via INTRAVENOUS

## 2019-11-05 MED ORDER — DIPHENHYDRAMINE HCL 25 MG PO CAPS: 25.0000 mg | ORAL_CAPSULE | Freq: Four times a day (QID) | ORAL | Status: DC | PRN

## 2019-11-05 MED ORDER — BUPIVACAINE HCL (PF) 0.25 % IJ SOLN
INTRAMUSCULAR | Status: AC
  Filled 2019-11-05: qty 30

## 2019-11-05 MED ORDER — KETOROLAC TROMETHAMINE 30 MG/ML IJ SOLN: 30.0000 mg | Freq: Four times a day (QID) | INTRAMUSCULAR | Status: AC | PRN

## 2019-11-05 MED ORDER — SIMETHICONE 80 MG PO CHEW: 80.0000 mg | CHEWABLE_TABLET | ORAL | Status: DC | PRN

## 2019-11-05 MED ORDER — NALOXONE HCL 4 MG/10ML IJ SOLN
1.0000 ug/kg/h | INTRAVENOUS | Status: DC | PRN
  Filled 2019-11-05: qty 5

## 2019-11-05 MED ORDER — MORPHINE SULFATE (PF) 0.5 MG/ML IJ SOLN
INTRAMUSCULAR | Status: DC | PRN
  Administered 2019-11-05: .15 mg via EPIDURAL

## 2019-11-05 MED ORDER — SCOPOLAMINE 1 MG/3DAYS TD PT72
MEDICATED_PATCH | TRANSDERMAL | Status: AC
  Filled 2019-11-05: qty 1

## 2019-11-05 MED ORDER — ONDANSETRON HCL 4 MG/2ML IJ SOLN
INTRAMUSCULAR | Status: AC
  Filled 2019-11-05: qty 2

## 2019-11-05 MED ORDER — POVIDONE-IODINE 10 % EX SWAB
2.0000 "application " | Freq: Once | CUTANEOUS | Status: AC
  Administered 2019-11-05: 2 via TOPICAL

## 2019-11-05 MED ORDER — OXYTOCIN-SODIUM CHLORIDE 30-0.9 UT/500ML-% IV SOLN
2.5000 [IU]/h | INTRAVENOUS | Status: AC
  Administered 2019-11-05: 2.5 [IU]/h via INTRAVENOUS
  Filled 2019-11-05: qty 500

## 2019-11-05 MED ORDER — OXYCODONE-ACETAMINOPHEN 5-325 MG PO TABS
1.0000 | ORAL_TABLET | ORAL | Status: DC | PRN
  Administered 2019-11-06 – 2019-11-07 (×2): 1 via ORAL
  Administered 2019-11-07: 2 via ORAL
  Filled 2019-11-05 (×2): qty 2
  Filled 2019-11-05: qty 1

## 2019-11-05 MED ORDER — DIPHENHYDRAMINE HCL 25 MG PO CAPS: 25.0000 mg | ORAL_CAPSULE | ORAL | Status: DC | PRN

## 2019-11-05 MED ORDER — PHENYLEPHRINE HCL-NACL 20-0.9 MG/250ML-% IV SOLN
INTRAVENOUS | Status: AC
  Filled 2019-11-05: qty 250

## 2019-11-05 MED ORDER — MENTHOL 3 MG MT LOZG: 1.0000 | LOZENGE | OROMUCOSAL | Status: DC | PRN

## 2019-11-05 MED ORDER — PHENYLEPHRINE 40 MCG/ML (10ML) SYRINGE FOR IV PUSH (FOR BLOOD PRESSURE SUPPORT): PREFILLED_SYRINGE | INTRAVENOUS | Status: DC | PRN

## 2019-11-05 MED ORDER — CEFAZOLIN SODIUM-DEXTROSE 2-4 GM/100ML-% IV SOLN
2.0000 g | INTRAVENOUS | Status: AC
  Administered 2019-11-05: 2 g via INTRAVENOUS

## 2019-11-05 MED ORDER — FENTANYL CITRATE (PF) 100 MCG/2ML IJ SOLN: 25.0000 ug | INTRAMUSCULAR | Status: DC | PRN

## 2019-11-05 MED ORDER — WITCH HAZEL-GLYCERIN EX PADS: 1.0000 "application " | MEDICATED_PAD | CUTANEOUS | Status: DC | PRN

## 2019-11-05 MED ORDER — CEFAZOLIN SODIUM-DEXTROSE 2-4 GM/100ML-% IV SOLN
INTRAVENOUS | Status: AC
  Filled 2019-11-05: qty 100

## 2019-11-05 MED ORDER — ZOLPIDEM TARTRATE 5 MG PO TABS: 5.0000 mg | ORAL_TABLET | Freq: Every evening | ORAL | Status: DC | PRN

## 2019-11-05 MED ORDER — TRANEXAMIC ACID-NACL 1000-0.7 MG/100ML-% IV SOLN
INTRAVENOUS | Status: DC | PRN
  Administered 2019-11-05: 1000 mg via INTRAVENOUS

## 2019-11-05 MED ORDER — NALBUPHINE HCL 10 MG/ML IJ SOLN: 5.0000 mg | INTRAMUSCULAR | Status: DC | PRN

## 2019-11-05 MED ORDER — SIMETHICONE 80 MG PO CHEW
80.0000 mg | CHEWABLE_TABLET | ORAL | Status: DC
  Administered 2019-11-06: 80 mg via ORAL
  Filled 2019-11-05 (×2): qty 1

## 2019-11-05 MED ORDER — TETANUS-DIPHTH-ACELL PERTUSSIS 5-2.5-18.5 LF-MCG/0.5 IM SUSY: 0.5000 mL | PREFILLED_SYRINGE | Freq: Once | INTRAMUSCULAR | Status: DC

## 2019-11-05 MED ORDER — MORPHINE SULFATE (PF) 0.5 MG/ML IJ SOLN
INTRAMUSCULAR | Status: AC
  Filled 2019-11-05: qty 10

## 2019-11-05 MED ORDER — METHYLERGONOVINE MALEATE 0.2 MG PO TABS: 0.2000 mg | ORAL_TABLET | ORAL | Status: DC | PRN

## 2019-11-05 MED ORDER — PRENATAL MULTIVITAMIN CH
1.0000 | ORAL_TABLET | Freq: Every day | ORAL | Status: DC
  Administered 2019-11-06 – 2019-11-07 (×2): 1 via ORAL
  Filled 2019-11-05 (×2): qty 1

## 2019-11-05 MED ORDER — COCONUT OIL OIL: 1.0000 "application " | TOPICAL_OIL | Status: DC | PRN

## 2019-11-05 MED ORDER — ACETAMINOPHEN 500 MG PO TABS
1000.0000 mg | ORAL_TABLET | Freq: Four times a day (QID) | ORAL | Status: AC
  Administered 2019-11-05 – 2019-11-06 (×4): 1000 mg via ORAL
  Filled 2019-11-05 (×4): qty 2

## 2019-11-05 MED ORDER — FENTANYL CITRATE (PF) 100 MCG/2ML IJ SOLN
INTRAMUSCULAR | Status: DC | PRN
  Administered 2019-11-05: 15 ug via INTRAVENOUS

## 2019-11-05 MED ORDER — LACTATED RINGERS IV SOLN: INTRAVENOUS | Status: DC

## 2019-11-05 MED ORDER — NALOXONE HCL 0.4 MG/ML IJ SOLN: 0.4000 mg | INTRAMUSCULAR | Status: DC | PRN

## 2019-11-05 MED ORDER — DEXAMETHASONE SODIUM PHOSPHATE 10 MG/ML IJ SOLN
INTRAMUSCULAR | Status: DC | PRN
  Administered 2019-11-05: 10 mg via INTRAVENOUS

## 2019-11-05 MED ORDER — BUPIVACAINE IN DEXTROSE 0.75-8.25 % IT SOLN
INTRATHECAL | Status: DC | PRN
  Administered 2019-11-05: 1.6 mL via INTRATHECAL

## 2019-11-05 MED ORDER — ONDANSETRON HCL 4 MG/2ML IJ SOLN: 4.0000 mg | Freq: Three times a day (TID) | INTRAMUSCULAR | Status: DC | PRN

## 2019-11-05 MED ORDER — SENNOSIDES-DOCUSATE SODIUM 8.6-50 MG PO TABS
2.0000 | ORAL_TABLET | ORAL | Status: DC
  Administered 2019-11-05 – 2019-11-06 (×2): 2 via ORAL
  Filled 2019-11-05 (×2): qty 2

## 2019-11-05 MED ORDER — DIPHENHYDRAMINE HCL 50 MG/ML IJ SOLN: 12.5000 mg | INTRAMUSCULAR | Status: DC | PRN

## 2019-11-05 MED ORDER — BUPIVACAINE IN DEXTROSE 0.75-8.25 % IT SOLN: INTRATHECAL | Status: DC | PRN

## 2019-11-05 MED ORDER — KETOROLAC TROMETHAMINE 30 MG/ML IJ SOLN
INTRAMUSCULAR | Status: AC
  Filled 2019-11-05: qty 1

## 2019-11-05 MED ORDER — FENTANYL CITRATE (PF) 100 MCG/2ML IJ SOLN
INTRAMUSCULAR | Status: AC
  Filled 2019-11-05: qty 2

## 2019-11-05 MED ORDER — IBUPROFEN 800 MG PO TABS
800.0000 mg | ORAL_TABLET | Freq: Four times a day (QID) | ORAL | Status: DC
  Administered 2019-11-06 – 2019-11-07 (×4): 800 mg via ORAL
  Filled 2019-11-05 (×5): qty 1

## 2019-11-05 MED ORDER — KETOROLAC TROMETHAMINE 30 MG/ML IJ SOLN
30.0000 mg | Freq: Once | INTRAMUSCULAR | Status: AC | PRN
  Administered 2019-11-05: 30 mg via INTRAVENOUS

## 2019-11-05 MED ORDER — METHYLERGONOVINE MALEATE 0.2 MG/ML IJ SOLN: 0.2000 mg | INTRAMUSCULAR | Status: DC | PRN

## 2019-11-05 MED ORDER — OXYTOCIN-SODIUM CHLORIDE 30-0.9 UT/500ML-% IV SOLN
INTRAVENOUS | Status: DC | PRN
  Administered 2019-11-05: 300 mL via INTRAVENOUS

## 2019-11-05 MED ORDER — BUPIVACAINE HCL (PF) 0.25 % IJ SOLN
INTRAMUSCULAR | Status: DC | PRN
  Administered 2019-11-05: 20 mL

## 2019-11-05 MED ORDER — SIMETHICONE 80 MG PO CHEW
80.0000 mg | CHEWABLE_TABLET | Freq: Three times a day (TID) | ORAL | Status: DC
  Administered 2019-11-05 – 2019-11-07 (×6): 80 mg via ORAL
  Filled 2019-11-05 (×5): qty 1

## 2019-11-05 MED ORDER — PHENYLEPHRINE HCL-NACL 20-0.9 MG/250ML-% IV SOLN
INTRAVENOUS | Status: DC | PRN
  Administered 2019-11-05: 30 ug/min via INTRAVENOUS

## 2019-11-05 MED ORDER — SODIUM CHLORIDE 0.9% FLUSH: 3.0000 mL | INTRAVENOUS | Status: DC | PRN

## 2019-11-05 MED ORDER — NALBUPHINE HCL 10 MG/ML IJ SOLN: 5.0000 mg | Freq: Once | INTRAMUSCULAR | Status: DC | PRN

## 2019-11-05 MED ORDER — SCOPOLAMINE 1 MG/3DAYS TD PT72
1.0000 | MEDICATED_PATCH | Freq: Once | TRANSDERMAL | Status: DC
  Administered 2019-11-05: 1.5 mg via TRANSDERMAL

## 2019-11-05 MED ORDER — TRANEXAMIC ACID-NACL 1000-0.7 MG/100ML-% IV SOLN
INTRAVENOUS | Status: AC
  Filled 2019-11-05: qty 100

## 2019-11-05 MED ORDER — ACETAMINOPHEN 10 MG/ML IV SOLN: 1000.0000 mg | Freq: Once | INTRAVENOUS | Status: DC | PRN

## 2019-11-05 MED ORDER — OXYTOCIN-SODIUM CHLORIDE 30-0.9 UT/500ML-% IV SOLN
INTRAVENOUS | Status: AC
  Filled 2019-11-05: qty 500

## 2019-11-05 SURGICAL SUPPLY — 39 items
APL SKNCLS STERI-STRIP NONHPOA (GAUZE/BANDAGES/DRESSINGS) ×1
BENZOIN TINCTURE PRP APPL 2/3 (GAUZE/BANDAGES/DRESSINGS) ×2 IMPLANT
CHLORAPREP W/TINT 26ML (MISCELLANEOUS) ×3 IMPLANT
CLAMP CORD UMBIL (MISCELLANEOUS) IMPLANT
CLOTH BEACON ORANGE TIMEOUT ST (SAFETY) ×3 IMPLANT
DRSG OPSITE POSTOP 4X10 (GAUZE/BANDAGES/DRESSINGS) ×3 IMPLANT
ELECT REM PT RETURN 9FT ADLT (ELECTROSURGICAL) ×3
ELECTRODE REM PT RTRN 9FT ADLT (ELECTROSURGICAL) ×1 IMPLANT
EXTRACTOR VACUUM M CUP 4 TUBE (SUCTIONS) IMPLANT
EXTRACTOR VACUUM M CUP 4' TUBE (SUCTIONS)
GLOVE BIO SURGEON STRL SZ7.5 (GLOVE) ×3 IMPLANT
GLOVE BIOGEL PI IND STRL 7.0 (GLOVE) ×1 IMPLANT
GLOVE BIOGEL PI INDICATOR 7.0 (GLOVE) ×2
GOWN STRL REUS W/TWL LRG LVL3 (GOWN DISPOSABLE) ×6 IMPLANT
KIT ABG SYR 3ML LUER SLIP (SYRINGE) IMPLANT
NDL HYPO 25X5/8 SAFETYGLIDE (NEEDLE) IMPLANT
NDL SPNL 20GX3.5 QUINCKE YW (NEEDLE) IMPLANT
NEEDLE HYPO 22GX1.5 SAFETY (NEEDLE) ×3 IMPLANT
NEEDLE HYPO 25X5/8 SAFETYGLIDE (NEEDLE) IMPLANT
NEEDLE SPNL 20GX3.5 QUINCKE YW (NEEDLE) IMPLANT
NS IRRIG 1000ML POUR BTL (IV SOLUTION) ×3 IMPLANT
PACK C SECTION WH (CUSTOM PROCEDURE TRAY) ×3 IMPLANT
PENCIL SMOKE EVAC W/HOLSTER (ELECTROSURGICAL) ×3 IMPLANT
STRIP SURGICAL 1/4 X 6 IN (GAUZE/BANDAGES/DRESSINGS) ×2 IMPLANT
SUT MNCRL 0 VIOLET CTX 36 (SUTURE) ×2 IMPLANT
SUT MNCRL AB 3-0 PS2 27 (SUTURE) IMPLANT
SUT MON AB 2-0 CT1 27 (SUTURE) ×3 IMPLANT
SUT MON AB-0 CT1 36 (SUTURE) ×6 IMPLANT
SUT MONOCRYL 0 CTX 36 (SUTURE) ×6
SUT PLAIN 0 NONE (SUTURE) IMPLANT
SUT PLAIN 2 0 (SUTURE)
SUT PLAIN 2 0 XLH (SUTURE) IMPLANT
SUT PLAIN ABS 2-0 CT1 27XMFL (SUTURE) IMPLANT
SUT VIC AB 4-0 KS 27 (SUTURE) ×2 IMPLANT
SYR 20CC LL (SYRINGE) IMPLANT
SYR CONTROL 10ML LL (SYRINGE) ×3 IMPLANT
TOWEL OR 17X24 6PK STRL BLUE (TOWEL DISPOSABLE) ×3 IMPLANT
TRAY FOLEY W/BAG SLVR 14FR LF (SET/KITS/TRAYS/PACK) ×3 IMPLANT
WATER STERILE IRR 1000ML POUR (IV SOLUTION) ×3 IMPLANT

## 2019-11-05 NOTE — Op Note (Signed)
Cesarean Section Procedure Note  Indications: macrosomia and previous uterine incision kerr x one  Pre-operative Diagnosis: 40 week 0 day pregnancy.  Post-operative Diagnosis: same  Surgeon: Lenoard Aden   Assistants: Idelle Jo, CNM  Anesthesia: Local anesthesia 0.25.% bupivacaine and Spinal anesthesia  ASA Class: 2  Procedure Details  The patient was seen in the Holding Room. The risks, benefits, complications, treatment options, and expected outcomes were discussed with the patient.  The patient concurred with the proposed plan, giving informed consent. The risks of anesthesia, infection, bleeding and possible injury to other organs discussed. Injury to bowel, bladder, or ureter with possible need for repair discussed. Possible need for transfusion with secondary risks of hepatitis or HIV acquisition discussed. Post operative complications to include but not limited to DVT, PE and Pneumonia noted. The site of surgery properly noted/marked. The patient was taken to Operating Room # C, identified as Milliana B Wilmore and the procedure verified as C-Section Delivery. A Time Out was held and the above information confirmed.  After induction of anesthesia, the patient was draped and prepped in the usual sterile manner. A Pfannenstiel incision was made and carried down through the subcutaneous tissue to the fascia. Fascial incision was made and extended transversely using Mayo scissors. The fascia was separated from the underlying rectus tissue superiorly and inferiorly. The peritoneum was identified and entered. Peritoneal incision was extended longitudinally. The utero-vesical peritoneal reflection was incised transversely and the bladder flap was bluntly freed from the lower uterine segment. A low transverse uterine incision(Kerr hysterotomy) was made. Delivered from OT presentation with vacuum assistance was a  female with Apgar scores of 9 at one minute and 9 at five minutes. Bulb suctioning  gently performed. Neonatal team in attendance.After the umbilical cord was clamped and cut cord blood was obtained for evaluation. The placenta was removed intact and appeared normal. The uterus was curetted with a dry lap pack. Good hemostasis was noted.The uterine outline, tubes and ovaries appeared normal. The uterine incision was closed with running locked sutures of 0 Monocryl x 2 layers. Hemostasis was observed. Lavage was carried out until clear.The parietal peritoneum was closed with a running 2-0 Monocryl suture. The fascia was then reapproximated with running sutures of 0 Monocryl. The skin was reapproximated with 4-0 vicryl after Labette closure.  Instrument, sponge, and needle counts were correct prior the abdominal closure and at the conclusion of the case.   Findings: FTLM, OA, nl adnexa.  Estimated Blood Loss:  500         Drains: foley                 Specimens: placenta                 Complications:  None; patient tolerated the procedure well.         Disposition: PACU - hemodynamically stable.         Condition: stable  Attending Attestation: I performed the procedure.

## 2019-11-05 NOTE — Progress Notes (Signed)
Went into patient room and urine was emptied out of foley bag due to improper closure.

## 2019-11-05 NOTE — H&P (Signed)
Hailey Davis is a 34 y.o. female presenting for rpt csection. EFW 4500gms. OB History    Gravida  2   Para  1   Term  1   Preterm      AB      Living  2     SAB      TAB      Ectopic      Multiple  1   Live Births  2          Past Medical History:  Diagnosis Date  . Medical history non-contributory   . PONV (postoperative nausea and vomiting)    Past Surgical History:  Procedure Laterality Date  . arm surgery    . CESAREAN SECTION MULTI-GESTATIONAL N/A 02/21/2017   Procedure: CESAREAN SECTION MULTI-GESTATIONAL;  Surgeon: Olivia Mackie, MD;  Location: [facility];  Service: Obstetrics;  Laterality: N/A;  . HAND SURGERY    . INCISION AND DRAINAGE ABSCESS Left 01/26/2017   Procedure: INCISION AND DRAINAGE ABSCESS;  Surgeon: Glenna Fellows, MD;  Location: [facility];  Service: General;  Laterality: Left;  I&D of L) Breast abcess  . NECK SURGERY    . WISDOM TOOTH EXTRACTION     Family History: family history includes Breast cancer in her maternal aunt and maternal grandmother; Heart attack in her maternal grandfather; Ovarian cancer in her paternal grandmother. Social History:  reports that she has never smoked. She has never used smokeless tobacco. She reports that she does not drink alcohol and does not use drugs.     Maternal Diabetes: No Genetic Screening: Normal Maternal Ultrasounds/Referrals: Normal Fetal Ultrasounds or other Referrals:  None Maternal Substance Abuse:  No Significant Maternal Medications:  Meds include: Other:  Significant Maternal Lab Results:  Group B Strep positive Other Comments:  None  Review of Systems  Constitutional: Negative.   All other systems reviewed and are negative.  Maternal Medical History:  Reason for admission: Contractions.   Contractions: Frequency: rare.   Perceived severity is mild.    Fetal activity: Perceived fetal activity is normal.    Prenatal complications: Polyhydramnios.   Prenatal  Complications - Diabetes: none.      unknown if currently breastfeeding. Maternal Exam:  Uterine Assessment: Contraction strength is mild.  Contraction frequency is rare.   Abdomen: Patient reports no abdominal tenderness. Surgical scars: low transverse.   Fetal presentation: vertex  Introitus: Normal vulva. Normal vagina.  Ferning test: not done.  Nitrazine test: not done. Amniotic fluid character: not assessed.  Pelvis: questionable for delivery.   Cervix: Cervix evaluated by digital exam.     Physical Exam Vitals and nursing note reviewed.  Constitutional:      Appearance: Normal appearance.  HENT:     Head: Normocephalic and atraumatic.  Cardiovascular:     Rate and Rhythm: Normal rate and regular rhythm.     Pulses: Normal pulses.     Heart sounds: Normal heart sounds.  Pulmonary:     Effort: Pulmonary effort is normal.     Breath sounds: Normal breath sounds.  Abdominal:     Palpations: Abdomen is soft.  Genitourinary:    General: Normal vulva.  Musculoskeletal:        General: Normal range of motion.  Skin:    General: Skin is warm and dry.  Neurological:     General: No focal deficit present.     Mental Status: She is alert and oriented to person, place, and time.  Psychiatric:  Mood and Affect: Mood normal.        Behavior: Behavior normal.     Prenatal labs: ABO, Rh: --/--/O NEG (11/02 1000) Antibody: POS (11/02 1000) Rubella: Immune (04/01 0000) RPR: NON REACTIVE (11/02 0952)  HBsAg: Negative (04/01 0000)  HIV: Non-reactive (04/01 0000)  GBS:   pos  Assessment/Plan: 40 wk IUP Previous csection for active phase arrest EFW 4500gms Rpt csection. Surgical risks discussed. Consent done.   Quadre Bristol J 11/05/2019, 6:38 AM

## 2019-11-05 NOTE — Anesthesia Procedure Notes (Signed)
Spinal  Patient location during procedure: OR Start time: 11/05/2019 12:30 PM End time: 11/05/2019 12:37 PM Staffing Performed: anesthesiologist  Anesthesiologist: Elmer Picker, MD Preanesthetic Checklist Completed: patient identified, IV checked, risks and benefits discussed, surgical consent, monitors and equipment checked, pre-op evaluation and timeout performed Spinal Block Patient position: sitting Prep: DuraPrep and site prepped and draped Patient monitoring: cardiac monitor, continuous pulse ox and blood pressure Approach: midline Location: L3-4 Injection technique: single-shot Needle Needle type: Pencan  Needle gauge: 24 G Needle length: 9 cm Assessment Sensory level: T6 Additional Notes Functioning IV was confirmed and monitors were applied. Sterile prep and drape, including hand hygiene and sterile gloves were used. The patient was positioned and the spine was prepped. The skin was anesthetized with lidocaine.  Free flow of clear CSF was obtained prior to injecting local anesthetic into the CSF.  The spinal needle aspirated freely following injection.  The needle was carefully withdrawn.  The patient tolerated the procedure well.

## 2019-11-05 NOTE — Transfer of Care (Signed)
Immediate Anesthesia Transfer of Care Note  Patient: Hailey Davis  Procedure(s) Performed: Repeat CESAREAN SECTION (N/A )  Patient Location: PACU  Anesthesia Type:Spinal  Level of Consciousness: alert   Airway & Oxygen Therapy: Patient Spontanous Breathing  Post-op Assessment: Report given to RN  Post vital signs: Reviewed and stable  Last Vitals:  Vitals Value Taken Time  BP 98/65 11/05/19 1430  Temp 36.7 C 11/05/19 1345  Pulse 59 11/05/19 1437  Resp 16 11/05/19 1437  SpO2 97 % 11/05/19 1437  Vitals shown include unvalidated device data.  Last Pain:  Vitals:   11/05/19 1430  TempSrc:   PainSc: 0-No pain      Patients Stated Pain Goal: 4 (11/05/19 1053)  Complications: No complications documented.

## 2019-11-05 NOTE — Progress Notes (Addendum)
Patient seen and examined. Consent witnessed and signed. No changes noted. Update completed. BP 114/87   Pulse 83   Temp 99 F (37.2 C) (Oral)   Resp 18   Ht 5\' 4"  (1.626 m)   Wt 91.2 kg   SpO2 100%   BMI 34.50 kg/m   CBC    Component Value Date/Time   WBC 10.1 11/03/2019 0952   RBC 4.32 11/03/2019 0952   HGB 13.4 11/03/2019 0952   HCT 38.8 11/03/2019 0952   PLT 151 11/03/2019 0952   MCV 89.8 11/03/2019 0952   MCH 31.0 11/03/2019 0952   MCHC 34.5 11/03/2019 0952   RDW 12.7 11/03/2019 0952

## 2019-11-05 NOTE — Anesthesia Preprocedure Evaluation (Addendum)
Anesthesia Evaluation  Patient identified by MRN, date of birth, ID band Patient awake    Reviewed: Allergy & Precautions, NPO status , Patient's Chart, lab work & pertinent test results  History of Anesthesia Complications (+) PONV  Airway Mallampati: II  TM Distance: >3 FB Neck ROM: Full    Dental no notable dental hx.    Pulmonary neg pulmonary ROS,    Pulmonary exam normal breath sounds clear to auscultation       Cardiovascular negative cardio ROS Normal cardiovascular exam Rhythm:Regular Rate:Normal     Neuro/Psych negative neurological ROS  negative psych ROS   GI/Hepatic negative GI ROS, Neg liver ROS,   Endo/Other  negative endocrine ROS  Renal/GU negative Renal ROS  negative genitourinary   Musculoskeletal negative musculoskeletal ROS (+)   Abdominal   Peds  Hematology negative hematology ROS (+)   Anesthesia Other Findings Repeat C/S  Reproductive/Obstetrics (+) Pregnancy                            Anesthesia Physical Anesthesia Plan  ASA: II  Anesthesia Plan: Spinal   Post-op Pain Management:    Induction:   PONV Risk Score and Plan: 3 and Treatment may vary due to age or medical condition  Airway Management Planned: Natural Airway  Additional Equipment:   Intra-op Plan:   Post-operative Plan:   Informed Consent: I have reviewed the patients History and Physical, chart, labs and discussed the procedure including the risks, benefits and alternatives for the proposed anesthesia with the patient or authorized representative who has indicated his/her understanding and acceptance.     Dental advisory given  Plan Discussed with: CRNA  Anesthesia Plan Comments:         Anesthesia Quick Evaluation

## 2019-11-05 NOTE — Anesthesia Postprocedure Evaluation (Signed)
Anesthesia Post Note  Patient: Hailey Davis  Procedure(s) Performed: Repeat CESAREAN SECTION (N/A )     Patient location during evaluation: PACU Anesthesia Type: Spinal Level of consciousness: oriented and awake and alert Pain management: pain level controlled Vital Signs Assessment: post-procedure vital signs reviewed and stable Respiratory status: spontaneous breathing, respiratory function stable and patient connected to nasal cannula oxygen Cardiovascular status: blood pressure returned to baseline and stable Postop Assessment: no headache, no backache and no apparent nausea or vomiting Anesthetic complications: no   No complications documented.  Last Vitals:  Vitals:   11/05/19 1430 11/05/19 1445  BP: 98/65 (!) 101/58  Pulse: 68 (!) 56  Resp: 17 14  Temp:    SpO2: 97% 98%    Last Pain:  Vitals:   11/05/19 1445  TempSrc:   PainSc: 0-No pain   Pain Goal: Patients Stated Pain Goal: 4 (11/05/19 1053)  LLE Motor Response: Purposeful movement (11/05/19 1445)   RLE Motor Response: Purposeful movement (11/05/19 1445)          [name]

## 2019-11-06 LAB — CBC
HCT: 35.5 % — ABNORMAL LOW (ref 36.0–46.0)
Hemoglobin: 12 g/dL (ref 12.0–15.0)
MCH: 31.3 pg (ref 26.0–34.0)
MCHC: 33.8 g/dL (ref 30.0–36.0)
MCV: 92.4 fL (ref 80.0–100.0)
Platelets: 146 10*3/uL — ABNORMAL LOW (ref 150–400)
RBC: 3.84 MIL/uL — ABNORMAL LOW (ref 3.87–5.11)
RDW: 12.8 % (ref 11.5–15.5)
WBC: 15.1 10*3/uL — ABNORMAL HIGH (ref 4.0–10.5)
nRBC: 0 % (ref 0.0–0.2)

## 2019-11-06 LAB — BIRTH TISSUE RECOVERY COLLECTION (PLACENTA DONATION)

## 2019-11-06 MED ORDER — RHO D IMMUNE GLOBULIN 1500 UNIT/2ML IJ SOSY
300.0000 ug | PREFILLED_SYRINGE | Freq: Once | INTRAMUSCULAR | Status: AC
  Administered 2019-11-06: 300 ug via INTRAVENOUS
  Filled 2019-11-06: qty 2

## 2019-11-06 NOTE — Lactation Note (Signed)
This note was copied from a baby's chart. Lactation Consultation Note  Patient Name: Hailey Davis Date: 11/06/2019 Reason for consult: Initial assessment;Term   P3 mother whose infant is now 67 hours old.  This is a term baby at 40+0 weeks.  Mother breast fed her other children for 3 months each.    Baby was swaddled and asleep in the bassinet when I arrived.  Mother had no questions/concerns related to breast feeding.  She informed me that this baby has latched much easier than her other children.  She denies pain with latching and feels uterine contractions during the feeding.  Mother will continue to feed 8-12 times/24 hours or sooner if baby shows cues.  Suggested she call her RN/LC for latch assist/observation as needed.  Mom made aware of O/P services, breastfeeding support groups, community resources, and our phone # for post-discharge questions. Mother has a DEBP for home use.  Father present.   Maternal Data Formula Feeding for Exclusion: No Has patient been taught Hand Expression?: Yes Does the patient have breastfeeding experience prior to this delivery?: Yes  Feeding    LATCH Score                   Interventions    Lactation Tools Discussed/Used     Consult Status Consult Status: Follow-up Date: 11/07/19 Follow-up type: In-patient    Dora Sims 11/06/2019, 8:36 AM

## 2019-11-06 NOTE — Progress Notes (Signed)
Subjective: POD# 1 Live born female  Birth Weight: 9 lb 13.5 oz (4465 g) APGAR: 8, 9  Newborn Delivery   Birth date/time: 11/05/2019 13:00:00 Delivery type: C-Section, Low Transverse Trial of labor: No C-section categorization: Repeat     Baby name: Bowen Delivering provider: Olivia Mackie   Circumcision: Yes, planning Feeding: breast  Pain control at delivery: Spinal   Reports feeling good. Up to the bathroom without assistance. Bleeding is moderate. Breastfeeding going well.   Patient reports tolerating PO.   Pain controlled with ibuprofen (OTC) and narcotic analgesics including Oxy IR Denies HA/SOB/C/P/N/V/dizziness. Flatus yes. She reports vaginal bleeding as normal, without clots. She is ambulating and urinating without difficulty.     Objective: Vitals:   11/06/19 0000 11/06/19 0130 11/06/19 0500 11/06/19 0900  BP: (!) 87/45  (!) 88/47 (!) 83/52  Pulse: (!) 45     Resp: 18 17 17 18   Temp: 98.2 F (36.8 C)  98.4 F (36.9 C) 97.8 F (36.6 C)  TempSrc: Oral  Oral Oral  SpO2: 97% 98%  97%  Weight:      Height:          Intake/Output Summary (Last 24 hours) at 11/06/2019 1104 Last data filed at 11/06/2019 0945 Gross per 24 hour  Intake 1500 ml  Output 3475 ml  Net -1975 ml        Recent Labs    11/06/19 0604  WBC 15.1*  HGB 12.0  HCT 35.5*  PLT 146*     Blood type: --/--/O NEG Performed at [facility], [address] [place], [place]  [phone] (09/47)  Rubella: Immune (04/01 0000)   Vaccines:   TDaP          UTD           Flu             Offered at discharge                      COVID-19 UTD  Physical Exam:  General: alert and cooperative CV: Regular rate and rhythm Resp: clear Abdomen: soft, nontender, normal bowel sounds Incision: clean, dry and bloody drainage present, small amount of old blood Uterine Fundus: firm, below umbilicus, nontender Lochia: moderate Ext: extremities normal, atraumatic, no cyanosis or edema and no  edema, redness or tenderness in the calves or thighs  Assessment/Plan: 34 y.o.   POD# 1. 34                  Principal Problem:   Postpartum care following cesarean delivery (11/4)  Encourage rest when baby rests  Breastfeeding support  Encourage to ambulate  Routine post-op care Active Problems:   Previous cesarean delivery affecting pregnancy   Rh negative status during pregnancy  Administer Rhogam, infant is O POS  Anticipate discharge tomorrow.   04-15-1972, CNM, MSN 11/06/2019, 11:04 AM

## 2019-11-07 LAB — RH IG WORKUP (INCLUDES ABO/RH)
ABO/RH(D): O NEG
Fetal Screen: NEGATIVE
Gestational Age(Wks): 40
Unit division: 0

## 2019-11-07 MED ORDER — IBUPROFEN 800 MG PO TABS: 800.0000 mg | ORAL_TABLET | Freq: Four times a day (QID) | ORAL | 0 refills | Status: AC

## 2019-11-07 MED ORDER — OXYCODONE-ACETAMINOPHEN 5-325 MG PO TABS
1.0000 | ORAL_TABLET | Freq: Four times a day (QID) | ORAL | 0 refills | Status: AC | PRN
Start: 2019-11-07 — End: ?

## 2019-11-07 NOTE — Discharge Summary (Signed)
Postpartum Discharge Summary  Date of Service updated     Patient Name: Hailey Davis DOB: [birth year] MRN: [id number]  Date of admission: 11/05/2019 Delivery date:11/05/2019  Delivering provider: Olivia Mackie  Date of discharge: 11/07/2019  Admitting diagnosis: Previous cesarean section [Z98.891] Previous cesarean delivery affecting pregnancy [O34.219] Intrauterine pregnancy: [redacted]w[redacted]d     Secondary diagnosis:  Principal Problem:   Postpartum care following cesarean delivery (11/4) Active Problems:   Previous cesarean section   Previous cesarean delivery affecting pregnancy   Rh negative status during pregnancy  Additional problems: none    Discharge diagnosis: Term Pregnancy Delivered                                              Post partum procedures:rhogam Augmentation: N/A Complications: None  Hospital course: Sceduled C/S   34 y.o. yo G2P2003 at [redacted]w[redacted]d was admitted to the hospital 11/05/2019 for scheduled cesarean section with the following indication:Elective Repeat.Delivery details are as follows:  Membrane Rupture Time/Date: 1:00 PM ,11/05/2019   Delivery Method:C-Section, Low Transverse  Details of operation can be found in separate operative note.  Patient had an uncomplicated postpartum course.  She is ambulating, tolerating a regular diet, passing flatus, and urinating well. Patient is discharged home in stable condition on  11/07/19        Newborn Data: Birth date:11/05/2019  Birth time:1:00 PM  Gender:Female  Living status:Living  Apgars:8 ,9  NWGNFA:2130 g      Physical exam  Vitals:   11/06/19 0900 11/06/19 1400 11/06/19 1915 11/07/19 0700  BP: (!) 83/52 (!) 82/51 (!) 103/54 (!) 98/57  Pulse:  63 66 (!) 54  Resp: 18 15 15 16   Temp: 97.8 F (36.6 C) 97.8 F (36.6 C) 98.3 F (36.8 C) 98.4 F (36.9 C)  TempSrc: Oral Oral Oral Oral  SpO2: 97% 97% 97%   Weight:      Height:       General: alert, cooperative and no distress Lochia:  appropriate Uterine Fundus: firm Incision: Healing well with no significant drainage DVT Evaluation: No evidence of DVT seen on physical exam. Labs: Lab Results  Component Value Date   WBC 15.1 (H) 11/06/2019   HGB 12.0 11/06/2019   HCT 35.5 (L) 11/06/2019   MCV 92.4 11/06/2019   PLT 146 (L) 11/06/2019   CMP Latest Ref Rng & Units 01/25/2017  Creatinine 0.44 - 1.00 mg/dL 01/27/2017   Edinburgh Score: Edinburgh Postnatal Depression Scale Screening Tool 11/06/2019  I have been able to laugh and see the funny side of things. 0  I have looked forward with enjoyment to things. 0  I have blamed myself unnecessarily when things went wrong. 0  I have been anxious or worried for no good reason. 0  I have felt scared or panicky for no good reason. 0  Things have been getting on top of me. 0  I have been so unhappy that I have had difficulty sleeping. 0  I have felt sad or miserable. 0  I have been so unhappy that I have been crying. 0  The thought of harming myself has occurred to me. 0  Edinburgh Postnatal Depression Scale Total 0      After visit meds:  Allergies as of 11/07/2019   No Known Allergies     Medication List    TAKE these medications  ibuprofen 800 MG tablet Commonly known as: ADVIL Take 1 tablet (800 mg total) by mouth every 6 (six) hours.   PNV PO Take 1 tablet by mouth 2 (two) times daily.        Discharge home in stable condition Infant Feeding: Breast Infant Disposition:home with mother Discharge instruction: per After Visit Summary and Postpartum booklet. Activity: Advance as tolerated. Pelvic rest for 6 weeks.  Diet: routine diet Anticipated Birth Control: Unsure Postpartum Appointment:6 weeks Additional Postpartum F/U: none Future Appointments:No future appointments. Follow up Visit:      11/07/2019 Vick Frees, MD

## 2019-11-07 NOTE — Progress Notes (Signed)
No c/o; tol po, ambulating; +flatus, pain controlled; normal lochia, voiding w/o difficulty Wants d/c home today  Patient Vitals for the past 24 hrs:  BP Temp Temp src Pulse Resp SpO2  11/07/19 0700 (!) 98/57 98.4 F (36.9 C) Oral (!) 54 16 --  11/06/19 1915 (!) 103/54 98.3 F (36.8 C) Oral 66 15 97 %  11/06/19 1400 (!) 82/51 97.8 F (36.6 C) Oral 63 15 97 %  11/06/19 0900 (!) 83/52 97.8 F (36.6 C) Oral -- 18 97 %    A&ox3 rrr ctab Abd: soft, nt, nd; +BS; fundus firm and below umb; dressing: c/d/i LE: no edema, nt bilat  CBC Latest Ref Rng & Units 11/06/2019 11/03/2019 02/22/2017  WBC 4.0 - 10.5 K/uL 15.1(H) 10.1 18.2(H)  Hemoglobin 12.0 - 15.0 g/dL 82.7 07.8 11.3(L)  Hematocrit 36 - 46 % 35.5(L) 38.8 31.9(L)  Platelets 150 - 400 K/uL 146(L) 151 123(L)   A/P: pod 2 s/p rltcs 1. Doing well - d/c home and f/u in 6 wks 2. Rh neg, s/p rhogam 3. RI 4. Rh neg, s/p rhogam

## 2021-04-16 IMAGING — US US BREAST*R* LIMITED INC AXILLA
1 series · 8 of 8 positions shown · non-contrast
Comparison: None.

CLINICAL DATA: 33-year-old presenting with possible palpable lumps
in the UPPER portions of both breasts. The patient is currently
approximately 10 weeks pregnant. She had surgical drainage of a
breast abscess at the time of her first pregnancy in 6823.

Family history of breast cancer in her maternal aunt diagnosed in
her 20s and in her maternal grandmother diagnosed in her 50s.
EXAM:
LIMITED ULTRASOUND OF THE BILATERAL BREASTS

[Series 1: us breast*right* limited inc axilla · 0.07mm/px · 8 of 8 slices shown]
[im 1/8]
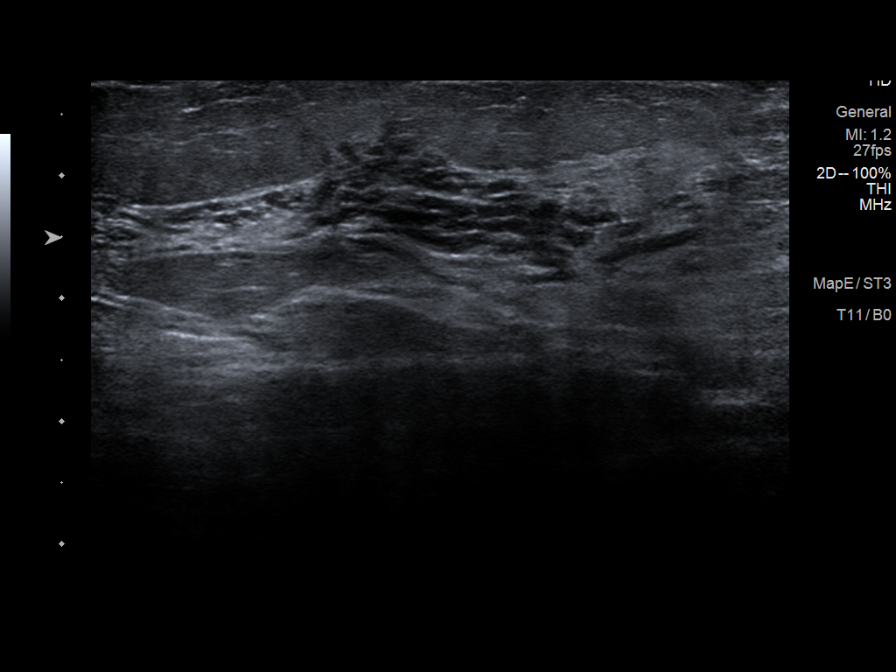
[im 2/8]
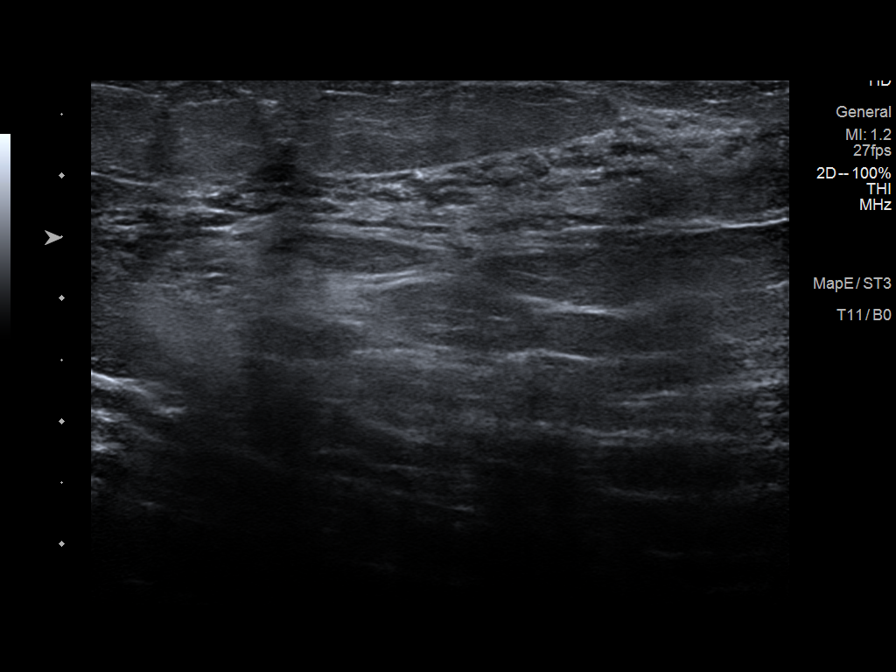
[im 3/8]
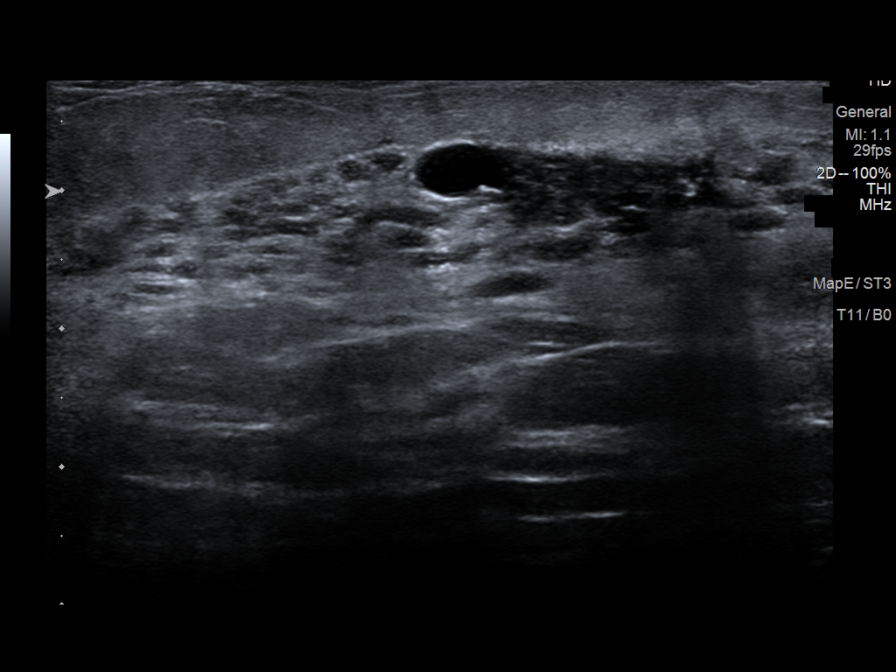
[im 4/8]
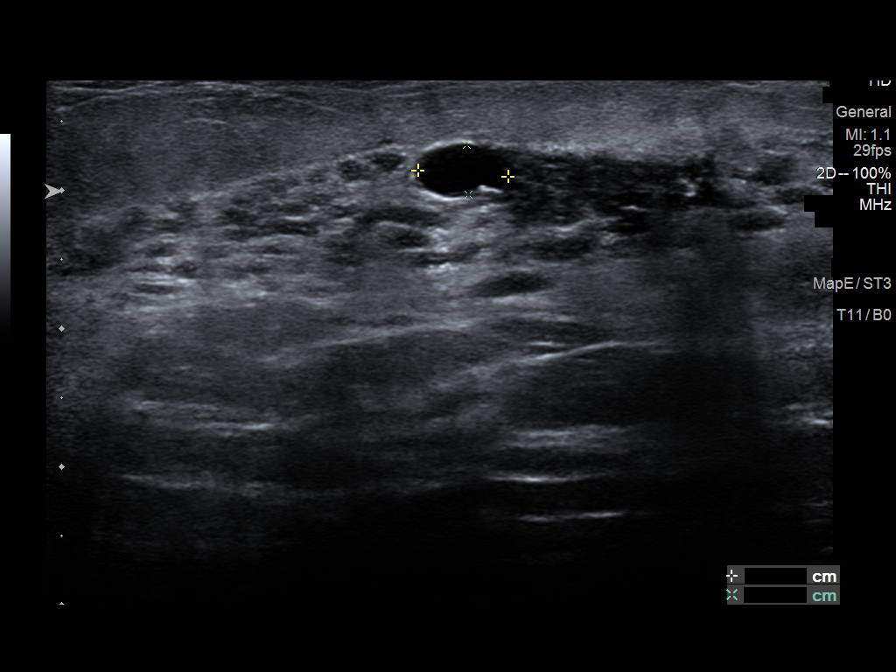
[im 5/8]
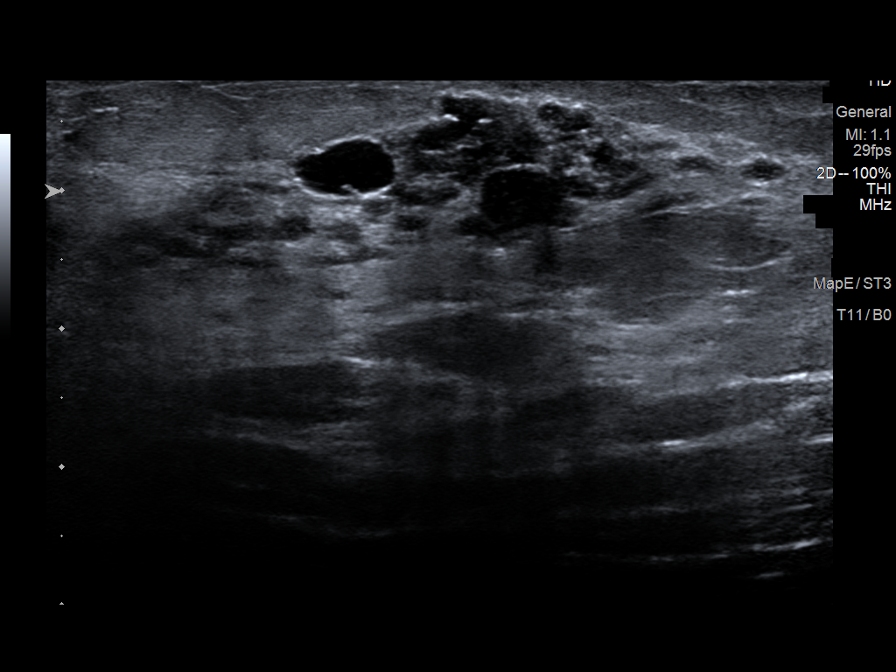
[im 6/8]
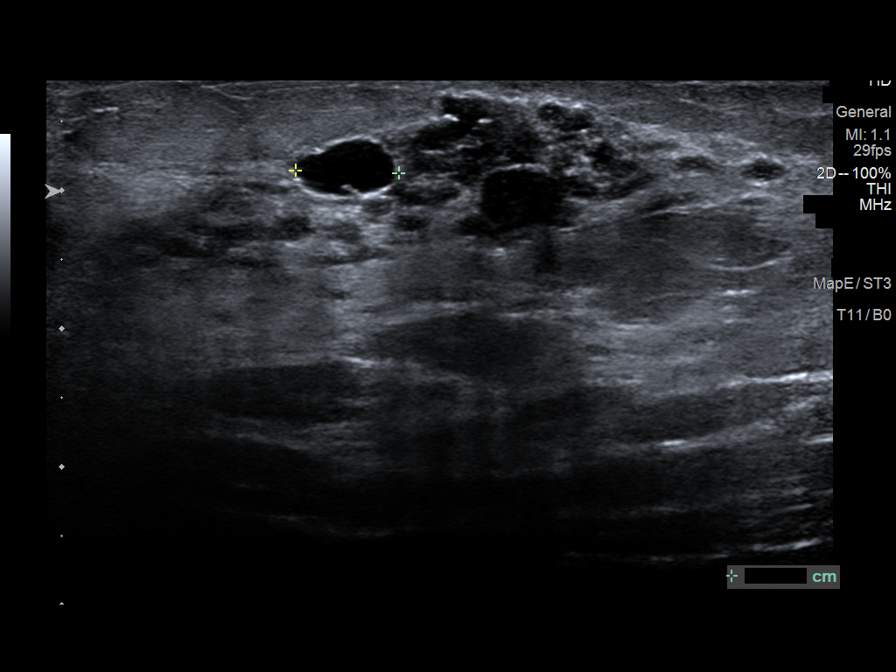
[im 7/8]
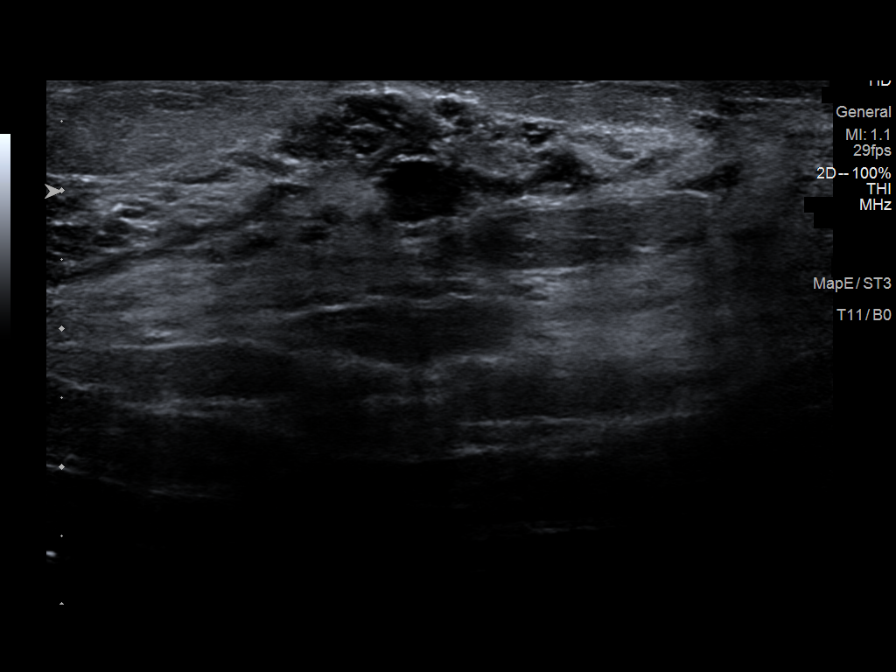
[im 8/8]
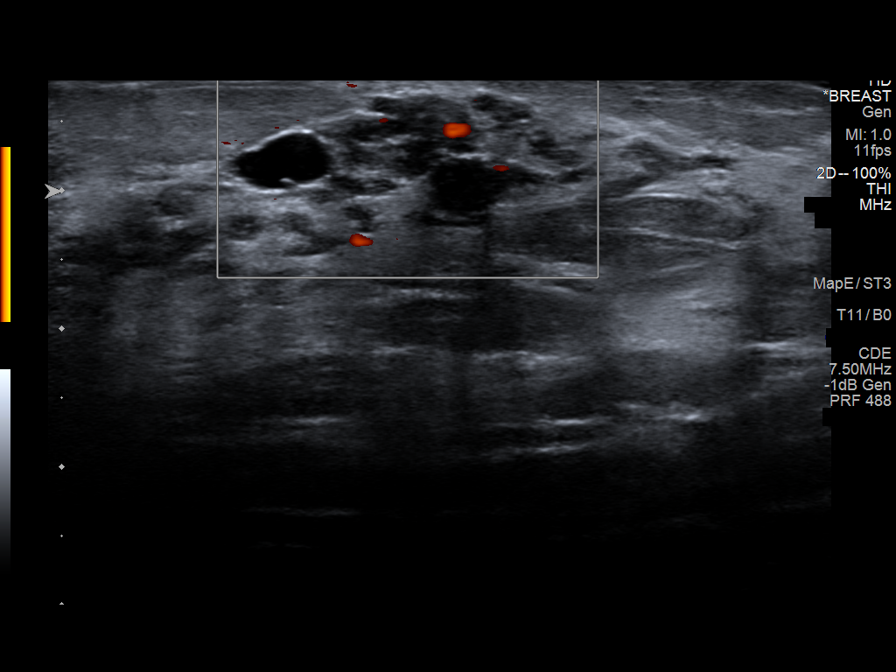

[8 of 8 positions shown; findings below may reference images not displayed]

FINDINGS: On correlative physical exam, there are palpable ridges of tissue in
the UPPER portions of both breast, though I do not palpate a
discrete mass in either breast.

Targeted RIGHT breast ultrasound is performed, showing normal dense
fibroglandular tissue and benign simple cysts at the 12 o'clock
position approximately 2 cm from the nipple in the area of palpable
concern. The largest cyst has a maximum diameter of 8 mm. No
suspicious solid mass, abnormal acoustic shadowing is identified and
there is no evidence of abscess.

Targeted LEFT breast ultrasound is performed, showing normal dense
fibroglandular tissue at the 12 o'clock position approximately 3 cm
from nipple in the area of palpable concern. No cyst, solid mass or
abnormal acoustic shadowing is identified in there is no evidence of
abscess.
IMPRESSION: 1. Subcentimeter benign simple cysts in the UPPER RIGHT breast
within normal dense fibroglandular tissue which accounts for the
palpable concern.
2. Normal dense fibroglandular tissue in the UPPER LEFT breast which
accounts for the palpable concern.
3. No sonographic evidence of malignancy involving either breast.

RECOMMENDATION:
Screening mammogram at age 41 unless there are persistent or
subsequent clinical concerns. (Code:[id number])

I have discussed the findings and recommendations with the patient.
If applicable, a reminder letter will be sent to the patient
regarding the next appointment.

BI-RADS CATEGORY  2: Benign.

## 2022-06-18 ENCOUNTER — Other Ambulatory Visit: Payer: Self-pay | Admitting: Family Medicine

## 2022-06-18 DIAGNOSIS — N644 Mastodynia: Secondary | ICD-10-CM

## 2022-06-27 ENCOUNTER — Ambulatory Visit
Admission: RE | Admit: 2022-06-27 | Discharge: 2022-06-27 | Disposition: A | Discharge status: Home / Self Care | Payer: 59 | Source: Ambulatory Visit | Attending: Family Medicine | Admitting: Family Medicine

## 2022-06-27 DIAGNOSIS — N644 Mastodynia: Secondary | ICD-10-CM

## 2022-06-28 ENCOUNTER — Other Ambulatory Visit: Payer: Self-pay | Admitting: Family Medicine

## 2022-06-28 DIAGNOSIS — R921 Mammographic calcification found on diagnostic imaging of breast: Secondary | ICD-10-CM

## 2022-07-06 ENCOUNTER — Ambulatory Visit
Admission: RE | Admit: 2022-07-06 | Discharge: 2022-07-06 | Disposition: A | Discharge status: Home / Self Care | Payer: 59 | Source: Ambulatory Visit | Attending: Family Medicine | Admitting: Family Medicine

## 2022-07-06 DIAGNOSIS — R921 Mammographic calcification found on diagnostic imaging of breast: Secondary | ICD-10-CM

## 2022-07-06 HISTORY — PX: BREAST BIOPSY: SHX20

## 2022-08-20 ENCOUNTER — Other Ambulatory Visit: Payer: Self-pay | Admitting: Family Medicine

## 2022-08-20 DIAGNOSIS — R921 Mammographic calcification found on diagnostic imaging of breast: Secondary | ICD-10-CM

## 2023-01-09 ENCOUNTER — Ambulatory Visit
Admission: RE | Admit: 2023-01-09 | Discharge: 2023-01-09 | Disposition: A | Discharge status: Home / Self Care | Payer: 59 | Source: Ambulatory Visit | Attending: Family Medicine | Admitting: Family Medicine

## 2023-01-09 DIAGNOSIS — R921 Mammographic calcification found on diagnostic imaging of breast: Secondary | ICD-10-CM

## 2023-01-14 ENCOUNTER — Other Ambulatory Visit: Payer: Self-pay | Admitting: Family Medicine

## 2023-01-14 DIAGNOSIS — R921 Mammographic calcification found on diagnostic imaging of breast: Secondary | ICD-10-CM

## 2023-06-17 ENCOUNTER — Ambulatory Visit
Admission: RE | Admit: 2023-06-17 | Discharge: 2023-06-17 | Disposition: A | Discharge status: Home / Self Care | Source: Ambulatory Visit | Attending: Family Medicine | Admitting: Family Medicine

## 2023-06-17 DIAGNOSIS — R921 Mammographic calcification found on diagnostic imaging of breast: Secondary | ICD-10-CM

## 2023-06-18 ENCOUNTER — Other Ambulatory Visit: Payer: Self-pay | Admitting: Family Medicine

## 2023-06-18 DIAGNOSIS — R921 Mammographic calcification found on diagnostic imaging of breast: Secondary | ICD-10-CM

## 2023-06-28 ENCOUNTER — Encounter

## 2023-12-20 ENCOUNTER — Encounter

## 2024-06-19 ENCOUNTER — Encounter
